# Patient Record
Sex: Female | Born: 1985
Health system: Southern US, Community
[De-identification: ages and names within clinical notes are randomized; demographics above are authoritative.]

## PROBLEM LIST (undated history)

## (undated) ENCOUNTER — Inpatient Hospital Stay (HOSPITAL_COMMUNITY): Payer: Self-pay

## (undated) DIAGNOSIS — Z973 Presence of spectacles and contact lenses: Secondary | ICD-10-CM

## (undated) DIAGNOSIS — E119 Type 2 diabetes mellitus without complications: Secondary | ICD-10-CM

## (undated) DIAGNOSIS — F32A Depression, unspecified: Secondary | ICD-10-CM

## (undated) DIAGNOSIS — M797 Fibromyalgia: Secondary | ICD-10-CM

## (undated) DIAGNOSIS — G5601 Carpal tunnel syndrome, right upper limb: Secondary | ICD-10-CM

## (undated) DIAGNOSIS — F329 Major depressive disorder, single episode, unspecified: Secondary | ICD-10-CM

## (undated) DIAGNOSIS — F419 Anxiety disorder, unspecified: Secondary | ICD-10-CM

## (undated) HISTORY — DX: Type 2 diabetes mellitus without complications: E11.9

## (undated) HISTORY — PX: OTHER SURGICAL HISTORY: SHX169

## (undated) HISTORY — DX: Anxiety disorder, unspecified: F41.9

## (undated) HISTORY — DX: Depression, unspecified: F32.A

## (undated) HISTORY — DX: Fibromyalgia: M79.7

---
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
## 2006-07-11 ENCOUNTER — Other Ambulatory Visit: Admission: RE | Admit: 2006-07-11 | Discharge: 2006-07-11 | Payer: Self-pay | Admitting: Obstetrics and Gynecology

## 2007-01-13 ENCOUNTER — Other Ambulatory Visit: Admission: RE | Admit: 2007-01-13 | Discharge: 2007-01-13 | Payer: Self-pay | Admitting: Obstetrics and Gynecology

## 2008-01-20 ENCOUNTER — Other Ambulatory Visit: Admission: RE | Admit: 2008-01-20 | Discharge: 2008-01-20 | Payer: Self-pay | Admitting: Obstetrics and Gynecology

## 2009-05-05 ENCOUNTER — Other Ambulatory Visit: Admission: RE | Admit: 2009-05-05 | Discharge: 2009-05-05 | Payer: Self-pay | Admitting: Obstetrics and Gynecology

## 2010-02-03 ENCOUNTER — Emergency Department (HOSPITAL_COMMUNITY): Admission: EM | Admit: 2010-02-03 | Discharge: 2010-02-03 | Payer: Self-pay | Admitting: Emergency Medicine

## 2010-06-08 ENCOUNTER — Other Ambulatory Visit: Admission: RE | Admit: 2010-06-08 | Discharge: 2010-06-08 | Payer: Self-pay | Admitting: Obstetrics and Gynecology

## 2010-10-22 LAB — CBC
HCT: 34.6 % — ABNORMAL LOW (ref 36.0–46.0)
Hemoglobin: 12.2 g/dL (ref 12.0–15.0)
MCH: 32.1 pg (ref 26.0–34.0)
MCHC: 35.1 g/dL (ref 30.0–36.0)
MCV: 91.3 fL (ref 78.0–100.0)
Platelets: 244 10*3/uL (ref 150–400)
RBC: 3.79 MIL/uL — ABNORMAL LOW (ref 3.87–5.11)
RDW: 12.3 % (ref 11.5–15.5)
WBC: 5.8 10*3/uL (ref 4.0–10.5)

## 2010-10-22 LAB — BASIC METABOLIC PANEL
BUN: 10 mg/dL (ref 6–23)
CO2: 27 mEq/L (ref 19–32)
Calcium: 9.4 mg/dL (ref 8.4–10.5)
Chloride: 109 mEq/L (ref 96–112)
Creatinine, Ser: 0.8 mg/dL (ref 0.4–1.2)
GFR calc Af Amer: >60 mL/min (ref >60)
GFR calc non Af Amer: >60 mL/min (ref >60)
Glucose, Bld: 102 mg/dL — ABNORMAL HIGH (ref 70–99)
Potassium: 3.7 mEq/L (ref 3.5–5.1)
Sodium: 141 mEq/L (ref 135–145)

## 2010-10-22 LAB — DIFFERENTIAL
Basophils Absolute: 0 10*3/uL (ref 0.0–0.1)
Basophils Relative: 1 % (ref 0–1)
Eosinophils Absolute: 0.1 10*3/uL (ref 0.0–0.7)
Eosinophils Relative: 2 % (ref 0–5)
Lymphocytes Relative: 40 % (ref 12–46)
Lymphs Abs: 2.3 10*3/uL (ref 0.7–4.0)
Monocytes Absolute: 0.5 10*3/uL (ref 0.1–1.0)
Monocytes Relative: 8 % (ref 3–12)
Neutro Abs: 2.8 10*3/uL (ref 1.7–7.7)
Neutrophils Relative %: 49 % (ref 43–77)

## 2010-10-22 LAB — TYPE AND SCREEN
ABO/RH(D): O POS
Antibody Screen: NEGATIVE

## 2010-10-22 LAB — PREGNANCY, URINE: Preg Test, Ur: NEGATIVE

## 2010-10-22 LAB — ABO/RH: ABO/RH(D): O POS

## 2010-10-22 LAB — HCG, QUANTITATIVE, PREGNANCY: hCG, Beta Chain, Quant, S: 2 m[IU]/mL (ref <5)

## 2010-12-22 NOTE — H&P (Signed)
Kristy Fowler, FILIPPONE           ACCOUNT NO.:  [id number]   MEDICAL RECORD NO.:  [id number]          PATIENT TYPE:  INP   LOCATION:  NA                            [facility]ILITY:  [facility]   PHYSICIAN:  Charles A. Delcambre, MDDATE OF BIRTH:  1986/07/05   DATE OF ADMISSION:  DATE OF DISCHARGE:                                HISTORY & PHYSICAL   CHIEF COMPLAINT:  Pregnancy-induced hypertension and 38-week pregnancy.   HISTORY OF PRESENT ILLNESS:  An 25 year old gravida 1 para 0-0-0-0 with [facility]  June 20, 2005, at 38 weeks 0 days upon admission; now to be induced  after presenting to the office today with an ongoing headache, worsening,  and pregnancy-induced hypertension labs with a uric acid creeping up now to  6.3, up from 6.0 on June 04, 2005; and blood pressure increasing now from  120s over 80s at 35 and 4, 130/90 at 36 and 5, 140/90 at 37 weeks, 130/90 at  37 weeks 2 days, 138/90 at 37 weeks 5 days, now 140/98 with the same blood  pressure 139-140 over 98 with blood pressure at home over the last 2 days.  Recheck blood pressure here 138/98 with continued headache and labs as noted  above. She is to be admitted now for induction secondary to these changes.  She has negative protein on urinalysis today.   PAST MEDICAL HISTORY:  UTI.   SURGICAL HISTORY:  Fracture of the leg.   MEDICATIONS:  Phenergan.   ALLERGIES:  SEDATIVE when she had an MRI, likely ATIVAN, made her go wild.   SOCIAL HISTORY:  No tobacco, ethanol, or drug use. The patient not sexually  active at this time. Not married.   FAMILY HISTORY:  Thrombophlebitis in maternal grandmother. Heart disease in  maternal grandfather. COPD in maternal grandfather. Diabetes in maternal  great-grandmother. Seizure disorder in her brother. Cancer, unknown source,  in a maternal aunt. The patient had club foot surgeries - that was her  extremity surgery. Maternal grandfather had MS. Otherwise, negative for  major illnesses.   REVIEW OF SYSTEMS:  No chest pain, shortness of breath. Positive for  headache. No scotomata, right upper quadrant pain. No shortness of breath,  wheezing. No contractions, ruptured membranes, or bleeding. Active fetal  movement is noted. No significant edema.   PHYSICAL EXAMINATION:  GENERAL:  Alert and oriented x3.  VITAL SIGNS:  Blood pressure 140/98. Weight 192 pounds, stable over the last  week, 2 pounds over the last 2 weeks of weight gain. Respirations 16, pulse  80.  HEENT:  Grossly within normal limits.  NECK:  Supple without thyromegaly.  LUNGS:  Clear bilaterally.  HEART:  Regular rate and rhythm with a 2/6 systolic ejection murmur left  sternal border.  BREASTS:  No masses, tenderness, discharge, skin or nipple change  bilaterally.  ABDOMEN:  Soft, gravid. Fundal height 37 cm. Estimated fetal weight 3500 g.  Fetal heart rate 150s.  PELVIC:  Cervix loose 1 cm, posterior, soft, 50%, -2, vertex, intact.  EXTREMITIES:  Minimal edema. Deep tendon reflexes 1-2+.   ASSESSMENT:  1.  Intrauterine pregnancy at 38 weeks.  2.  Pregnancy-induced hypertension not meeting definitive diagnosis of      preeclampsia without proteinuria but likely on this path.   PLAN:  Serial induction. Magnesium prophylaxis if headache worsens, labs  worsen, blood pressure worsens, or proteinuria develops. Repeat PIH labs in  the morning. Will place Cervidil or run Pitocin at low dose overnight at 2  mU/minute and start high-dose Pitocin in the morning once cervix is ripened  further, and proceed with AROM at that point as well. All questions are  answered and we will proceed as outlined. The patient is in agreement.      Charles A. Sydnee Cabal, MD  Electronically Signed     CAD/MEDQ  D:  06/06/2005  T:  06/06/2005  Job:  [id number]

## 2010-12-22 NOTE — Op Note (Signed)
Kristy Fowler, Kristy Fowler           ACCOUNT NO.:  [id number]   MEDICAL RECORD NO.:  [id number]          PATIENT TYPE:  INP   LOCATION:  9163                          [facility]ACILITY:  [facility]   PHYSICIAN:  Charles A. Delcambre, MDDATE OF BIRTH:  [birth year]   DATE OF PROCEDURE:  DATE OF DISCHARGE:                                 OPERATIVE REPORT   DELIVERY NOTE:  This patient was admitted to undergo induction secondary to pregnancy-  induced hypertension with blood pressure in the office of 140/98, gradually  increasing over the last several days.  See dictated history and physical.  In the hospital, blood pressure had settled to 130s/80s and 120s/80s.  She  had no protein of note.  PIH labs were negative in the office yesterday with  the exception of uric acid level of 6.3.  Cervix was 1-1.5, 50%, -2 station,  posterior and soft and Cervidil was placed overnight.  At 5:30 this morning,  with reactive fetal heart rate tracing overnight she had Cervidil removed  and cervix was 2 cm dilated upon my arrival at 0810 hours this morning.  She  was noted to be 8-0 cm, complete and 0 station.  Artifical rupture of  membranes was done.  Fetal and scalp electrode was placed.  No fluid was  noted.  Fetal movement was active and there was positive scalp stimulation  and fetal heart rate noted.  She was having contractions every 1-2 minutes,  some hyperstimulation about an hour prior.  At that time fetal heart rate  was 140s with early decelerations versus late decelerations.  After fetal  scalp electrode placement, this was a reactive tracing with some mild  variables occasionally.  She rapidly progressed to complete dilation at 0900  hours and delivered at 0907 hours a vigorous female.  Placenta delivered at  0918 hours and nuchal cord was reduced for delivery.  Apgars were 8 and 9.  Vigorous female.  Cord PH returned 7.24 arterial; 7.31 venous.  A second-  degree midline laceration was repaired with local  anesthetic.  Estimated  blood loss was 300 mL.  Mother and baby are recovering stably at this time.  Grandmother of the baby cut the cord.  Placenta was three-vessel and intact  and spontaneous.      Charles A. Sydnee Cabal, MD  Electronically Signed     CAD/MEDQ  D:  06/07/2005  T:  06/07/2005  Job:  [id number]

## 2011-04-13 ENCOUNTER — Other Ambulatory Visit: Payer: Self-pay | Admitting: Family Medicine

## 2011-04-13 DIAGNOSIS — R413 Other amnesia: Secondary | ICD-10-CM

## 2011-04-13 DIAGNOSIS — R51 Headache: Secondary | ICD-10-CM

## 2011-04-14 ENCOUNTER — Ambulatory Visit
Admission: RE | Admit: 2011-04-14 | Discharge: 2011-04-14 | Disposition: A | Discharge status: Home / Self Care | Payer: Medicaid Other | Source: Ambulatory Visit | Attending: Family Medicine | Admitting: Family Medicine

## 2011-04-14 DIAGNOSIS — R413 Other amnesia: Secondary | ICD-10-CM

## 2011-04-14 DIAGNOSIS — R51 Headache: Secondary | ICD-10-CM

## 2011-04-14 MED ORDER — GADOBENATE DIMEGLUMINE 529 MG/ML IV SOLN
10.0000 mL | Freq: Once | INTRAVENOUS | Status: AC | PRN
  Administered 2011-04-14: 10 mL via INTRAVENOUS

## 2011-07-06 ENCOUNTER — Other Ambulatory Visit: Payer: Self-pay | Admitting: Adult Health

## 2011-07-06 ENCOUNTER — Other Ambulatory Visit (HOSPITAL_COMMUNITY)
Admission: RE | Admit: 2011-07-06 | Discharge: 2011-07-06 | Disposition: A | Discharge status: Home / Self Care | Payer: Medicaid Other | Source: Ambulatory Visit | Attending: Obstetrics and Gynecology | Admitting: Obstetrics and Gynecology

## 2011-07-06 DIAGNOSIS — Z113 Encounter for screening for infections with a predominantly sexual mode of transmission: Secondary | ICD-10-CM | POA: Insufficient documentation

## 2011-07-06 DIAGNOSIS — Z01419 Encounter for gynecological examination (general) (routine) without abnormal findings: Secondary | ICD-10-CM | POA: Insufficient documentation

## 2012-01-03 ENCOUNTER — Other Ambulatory Visit (HOSPITAL_COMMUNITY): Payer: Self-pay | Admitting: Orthopedic Surgery

## 2012-01-03 DIAGNOSIS — M545 Low back pain, unspecified: Secondary | ICD-10-CM

## 2012-01-10 ENCOUNTER — Ambulatory Visit (HOSPITAL_COMMUNITY)
Admission: RE | Admit: 2012-01-10 | Discharge: 2012-01-10 | Disposition: A | Discharge status: Home / Self Care | Payer: Medicaid Other | Source: Ambulatory Visit | Attending: Orthopedic Surgery | Admitting: Orthopedic Surgery

## 2012-01-10 DIAGNOSIS — M79609 Pain in unspecified limb: Secondary | ICD-10-CM | POA: Insufficient documentation

## 2012-01-10 DIAGNOSIS — M545 Low back pain, unspecified: Secondary | ICD-10-CM | POA: Insufficient documentation

## 2012-04-03 LAB — OB RESULTS CONSOLE RUBELLA ANTIBODY, IGM: Rubella: IMMUNE

## 2012-04-03 LAB — OB RESULTS CONSOLE ANTIBODY SCREEN: Antibody Screen: NEGATIVE

## 2012-04-03 LAB — OB RESULTS CONSOLE ABO/RH: RH Type: POSITIVE

## 2012-04-03 LAB — OB RESULTS CONSOLE VARICELLA ZOSTER ANTIBODY, IGG: Varicella: IMMUNE

## 2012-04-03 LAB — OB RESULTS CONSOLE HEPATITIS B SURFACE ANTIGEN: Hepatitis B Surface Ag: NEGATIVE

## 2012-08-06 NOTE — L&D Delivery Note (Signed)
FAY BAGG is a 27 y.o. [id number] presenting at [redacted]w[redacted]d in active labor. She progressed to complete dilation without epidural and had a successful SVD.  Delivery Note At 5:17 PM a viable female was delivered via Vaginal, Spontaneous Delivery (Presentation: Left Occiput Anterior).  APGAR: 8, 9; weight .   Placenta status: Intact, Spontaneous.  Cord: 3 vessels with the following complications: None.  Cord pH: n/a  Anesthesia: None  Episiotomy: None Lacerations: None Suture Repair: n/a Est. Blood Loss (mL): 250  Mom to postpartum.  Baby to nursery-stable.  Napoleon Form 11/21/2012, 5:43 PM

## 2012-08-23 ENCOUNTER — Encounter (HOSPITAL_COMMUNITY): Payer: Self-pay | Admitting: *Deleted

## 2012-08-23 ENCOUNTER — Inpatient Hospital Stay (HOSPITAL_COMMUNITY)
Admission: AD | Admit: 2012-08-23 | Discharge: 2012-08-23 | Disposition: A | Discharge status: Home / Self Care | Payer: Medicaid Other | Source: Ambulatory Visit | Attending: Obstetrics & Gynecology | Admitting: Obstetrics & Gynecology

## 2012-08-23 DIAGNOSIS — O10019 Pre-existing essential hypertension complicating pregnancy, unspecified trimester: Secondary | ICD-10-CM | POA: Insufficient documentation

## 2012-08-23 DIAGNOSIS — O2650 Maternal hypotension syndrome, unspecified trimester: Secondary | ICD-10-CM | POA: Insufficient documentation

## 2012-08-23 DIAGNOSIS — R55 Syncope and collapse: Secondary | ICD-10-CM

## 2012-08-23 LAB — COMPREHENSIVE METABOLIC PANEL
ALT: 7 U/L (ref 0–35)
AST: 11 U/L (ref 0–37)
Albumin: 2.7 g/dL — ABNORMAL LOW (ref 3.5–5.2)
Alkaline Phosphatase: 83 U/L (ref 39–117)
BUN: 6 mg/dL (ref 6–23)
CO2: 22 mEq/L (ref 19–32)
Calcium: 8.9 mg/dL (ref 8.4–10.5)
Chloride: 103 mEq/L (ref 96–112)
Creatinine, Ser: 0.6 mg/dL (ref 0.50–1.10)
GFR calc Af Amer: >90 mL/min (ref >90)
GFR calc non Af Amer: >90 mL/min (ref >90)
Glucose, Bld: 86 mg/dL (ref 70–99)
Potassium: 4.2 mEq/L (ref 3.5–5.1)
Sodium: 135 mEq/L (ref 135–145)
Total Bilirubin: 0.2 mg/dL — ABNORMAL LOW (ref 0.3–1.2)
Total Protein: 6.9 g/dL (ref 6.0–8.3)

## 2012-08-23 LAB — URINALYSIS, ROUTINE W REFLEX MICROSCOPIC
Bilirubin Urine: NEGATIVE
Glucose, UA: NEGATIVE mg/dL
Ketones, ur: NEGATIVE mg/dL
Nitrite: NEGATIVE
Protein, ur: NEGATIVE mg/dL
Specific Gravity, Urine: 1.015 (ref 1.005–1.030)
Urobilinogen, UA: 0.2 mg/dL (ref 0.0–1.0)
pH: 6 (ref 5.0–8.0)

## 2012-08-23 LAB — CBC
HCT: 32.4 % — ABNORMAL LOW (ref 36.0–46.0)
Hemoglobin: 11.1 g/dL — ABNORMAL LOW (ref 12.0–15.0)
MCH: 30.7 pg (ref 26.0–34.0)
MCHC: 34.3 g/dL (ref 30.0–36.0)
MCV: 89.8 fL (ref 78.0–100.0)
Platelets: 218 10*3/uL (ref 150–400)
RBC: 3.61 MIL/uL — ABNORMAL LOW (ref 3.87–5.11)
RDW: 12.9 % (ref 11.5–15.5)
WBC: 9.1 10*3/uL (ref 4.0–10.5)

## 2012-08-23 LAB — URINE MICROSCOPIC-ADD ON

## 2012-08-23 NOTE — MAU Provider Note (Signed)
History     CSN: [id number]  Arrival date and time: 08/23/12 1327   None     Chief Complaint  Patient presents with  . Dizziness   HPI Kristy Fowler is a 27 y.o. G64P1011 female at [redacted]w[redacted]d who presents with report of near syncope this am at app 1100.  BP per home monitor at 1200 120/70. Had some early elevated bp's in office, was started on Aldomet 500mg  BID 1 week ago, last dose last night at 2000, hasn't yet taken this am's dose.  Lightheaded w/ HA's before being put on aldomet, still lightheaded daily, but no further HAs.  This am, she felt lightheaded and nauseated, got cold chills, lost vision and sat down on couch- doesn't think passed out.  Reports good fetal movement.  Denies lof. Some brownish spotting yesterday, none since, not related to sexual intercourse. Denies malodorous d/c or vulvovaginal itching/irriation. Denies dysuria, urinary frequency, hesitancy, urgency.  Next appt at [facility] on 1/30.   OB History    Grav Para Term Preterm Abortions TAB SAB Ect Mult Living   3 1 1  1 1    1       Past Medical History  Diagnosis Date  . Hypertension     on Methyldopa    No past surgical history on file.  No family history on file.  History  Substance Use Topics  . Smoking status: Not on file  . Smokeless tobacco: Not on file  . Alcohol Use: Not on file    Allergies: No Known Allergies  Prescriptions prior to admission  Medication Sig Dispense Refill  . methyldopa (ALDOMET) 500 MG tablet Take 500 mg by mouth 2 (two) times daily.      . Prenatal Vit-Fe Fumarate-FA (PRENATAL MULTIVITAMIN) TABS Take 1 tablet by mouth daily.        Review of Systems  Constitutional: Negative for fever. Chills: had episode of 'cold chills' right before almost passed out.  Eyes: Negative.        Lost vision briefly during episode, states vision fine now   Respiratory: Negative.   Cardiovascular: Negative.   Gastrointestinal: Positive for nausea (this am). Negative for vomiting and  abdominal pain.  Genitourinary: Negative.   Musculoskeletal: Negative.   Skin: Negative.   Neurological: Negative for headaches.       Lightheaded recently w/ near syncopal episode this am   Endo/Heme/Allergies: Negative.   Psychiatric/Behavioral: Negative.    Physical Exam   Blood pressure 118/67, pulse 78, temperature 97.9 F (36.6 C), temperature source Oral, resp. rate 18, height 5\' 4"  (1.626 m), weight 95.255 kg (210 lb), last menstrual period 12/30/2011, SpO2 99.00%.  Physical Exam  Constitutional: She is oriented to person, place, and time. She appears well-developed and well-nourished.  HENT:  Head: Normocephalic.  Neck: Normal range of motion.  Cardiovascular: Normal rate and regular rhythm.   Respiratory: Effort normal and breath sounds normal.  GI: Soft. There is no tenderness.       gravid  Genitourinary: Vagina normal and uterus normal.       Spec exam: cervix visually closed, no bleeding SVE: LTC  Musculoskeletal: Normal range of motion.  Neurological: She is alert and oriented to person, place, and time. She has normal reflexes.  Skin: Skin is warm and dry.  Psychiatric: She has a normal mood and affect. Her behavior is normal. Judgment and thought content normal.    MAU Course  Procedures  EFM CBC, CMP 12 lead EKG: normal sinus  rhythm, no abnormalities  Results for orders placed during the hospital encounter of 08/23/12 (from the past 24 hour(s))  URINALYSIS, ROUTINE W REFLEX MICROSCOPIC     Status: Abnormal   Collection Time   08/23/12  1:40 PM      Component Value Range   Color, Urine YELLOW  YELLOW   APPearance CLEAR  CLEAR   Specific Gravity, Urine 1.015  1.005 - 1.030   pH 6.0  5.0 - 8.0   Glucose, UA NEGATIVE  NEGATIVE mg/dL   Hgb urine dipstick TRACE (*) NEGATIVE   Bilirubin Urine NEGATIVE  NEGATIVE   Ketones, ur NEGATIVE  NEGATIVE mg/dL   Protein, ur NEGATIVE  NEGATIVE mg/dL   Urobilinogen, UA 0.2  0.0 - 1.0 mg/dL   Nitrite NEGATIVE   NEGATIVE   Leukocytes, UA SMALL (*) NEGATIVE  URINE MICROSCOPIC-ADD ON     Status: Abnormal   Collection Time   08/23/12  1:40 PM      Component Value Range   Squamous Epithelial / LPF MANY (*) RARE   WBC, UA 3-6  <3 WBC/hpf   RBC / HPF 0-2  <3 RBC/hpf   Bacteria, UA MANY (*) RARE  CBC     Status: Abnormal   Collection Time   08/23/12  2:56 PM      Component Value Range   WBC 9.1  4.0 - 10.5 K/uL   RBC 3.61 (*) 3.87 - 5.11 MIL/uL   Hemoglobin 11.1 (*) 12.0 - 15.0 g/dL   HCT 30.8 (*) 65.7 - 84.6 %   MCV 89.8  78.0 - 100.0 fL   MCH 30.7  26.0 - 34.0 pg   MCHC 34.3  30.0 - 36.0 g/dL   RDW 96.2  95.2 - 84.1 %   Platelets 218  150 - 400 K/uL  COMPREHENSIVE METABOLIC PANEL     Status: Abnormal   Collection Time   08/23/12  2:56 PM      Component Value Range   Sodium 135  135 - 145 mEq/L   Potassium 4.2  3.5 - 5.1 mEq/L   Chloride 103  96 - 112 mEq/L   CO2 22  19 - 32 mEq/L   Glucose, Bld 86  70 - 99 mg/dL   BUN 6  6 - 23 mg/dL   Creatinine, Ser 3.24  0.50 - 1.10 mg/dL   Calcium 8.9  8.4 - 40.1 mg/dL   Total Protein 6.9  6.0 - 8.3 g/dL   Albumin 2.7 (*) 3.5 - 5.2 g/dL   AST 11  0 - 37 U/L   ALT 7  0 - 35 U/L   Alkaline Phosphatase 83  39 - 117 U/L   Total Bilirubin 0.2 (*) 0.3 - 1.2 mg/dL   GFR calc non Af Amer >90  >90 mL/min   GFR calc Af Amer >90  >90 mL/min   Assessment and Plan  A:  [redacted]w[redacted]d SIUP  Near syncopal episode  Probable CHTN, just recently placed on Aldomet  Cat I FHR  Normal EKG P:  D/C home  F/U at [facility] on Mon, consider outpatient cardiology consult  Hold Aldomet if BP <= 140/90 until seen Mon (RN mom to check at home)  Increase po fluids   Discussed w/ Dr. Garth Schlatter, Cheron Every 08/23/2012, 2:43 PM

## 2012-08-23 NOTE — MAU Note (Signed)
"  I got up this morning and had a bowl of cereal.  About an hour to an hour and a half, I started feeling lightheaded.  I didn't actually didn't pass out, but I was at the point of blacking out.  I was already situated at the couch to sit down, so I eased myself down.  I was still really fuzzy feeling after that.  (+) FM.  No vomiting or diarrhea.  I felt a little nauseous beforehand, but I never threw up."

## 2012-08-23 NOTE — MAU Note (Addendum)
Pt reports she has been on medication for high blood pressure. Stated she felt a little nauseated and was walking and felt dizzy and lightheaded. Started having black out in her vision was able to sit down and stated to get better. Did not actually pass out. Still feeling lightheaded. B/p at home was 110/70.

## 2012-08-23 NOTE — MAU Provider Note (Signed)
Attestation of Attending Supervision of Advanced Practitioner (CNM/NP): Evaluation and management procedures were performed by the Advanced Practitioner under my supervision and collaboration. I have reviewed the Advanced Practitioner's note and chart, and I agree with the management and plan.  Diannie Willner H. 7:52 PM

## 2012-08-24 LAB — URINE CULTURE: Colony Count: 55000

## 2012-10-13 ENCOUNTER — Other Ambulatory Visit: Payer: Self-pay | Admitting: Obstetrics & Gynecology

## 2012-10-13 DIAGNOSIS — O10019 Pre-existing essential hypertension complicating pregnancy, unspecified trimester: Secondary | ICD-10-CM

## 2012-10-14 ENCOUNTER — Encounter: Payer: Self-pay | Admitting: *Deleted

## 2012-10-29 ENCOUNTER — Ambulatory Visit (INDEPENDENT_AMBULATORY_CARE_PROVIDER_SITE_OTHER): Payer: Medicaid Other | Admitting: Advanced Practice Midwife

## 2012-10-29 ENCOUNTER — Ambulatory Visit (INDEPENDENT_AMBULATORY_CARE_PROVIDER_SITE_OTHER): Payer: Medicaid Other

## 2012-10-29 ENCOUNTER — Other Ambulatory Visit: Payer: Self-pay | Admitting: Obstetrics & Gynecology

## 2012-10-29 VITALS — BP 138/88 | Wt 216.0 lb

## 2012-10-29 DIAGNOSIS — O10019 Pre-existing essential hypertension complicating pregnancy, unspecified trimester: Secondary | ICD-10-CM

## 2012-10-29 DIAGNOSIS — O10919 Unspecified pre-existing hypertension complicating pregnancy, unspecified trimester: Secondary | ICD-10-CM | POA: Insufficient documentation

## 2012-10-29 DIAGNOSIS — O09299 Supervision of pregnancy with other poor reproductive or obstetric history, unspecified trimester: Secondary | ICD-10-CM

## 2012-10-29 DIAGNOSIS — Z1389 Encounter for screening for other disorder: Secondary | ICD-10-CM

## 2012-10-29 DIAGNOSIS — O139 Gestational [pregnancy-induced] hypertension without significant proteinuria, unspecified trimester: Secondary | ICD-10-CM

## 2012-10-29 DIAGNOSIS — Z331 Pregnant state, incidental: Secondary | ICD-10-CM

## 2012-10-29 DIAGNOSIS — O10912 Unspecified pre-existing hypertension complicating pregnancy, second trimester: Secondary | ICD-10-CM

## 2012-10-29 LAB — POCT URINALYSIS DIPSTICK
Glucose, UA: NEGATIVE
Ketones, UA: NEGATIVE
Leukocytes, UA: NEGATIVE
Nitrite, UA: NEGATIVE
Protein, UA: NEGATIVE

## 2012-10-29 LAB — US OB FOLLOW UP

## 2012-10-29 NOTE — Progress Notes (Signed)
U/S-(36+2wks)-active vtx fetus, approp. Interval growth EFW 7 lb 3 oz (3248gms) 74%tile, fluid wnl AFI=9.17cm, BPP 8/8, post gr 2 plac, female fetus Konrad Felix")

## 2012-10-29 NOTE — Progress Notes (Signed)
Pt complain of a lot of pain and pressure.

## 2012-10-29 NOTE — Progress Notes (Signed)
No c/o at this time.  Routine questions about pregnancy answered.  F/U in 1 weeks for OBV.  

## 2012-11-05 ENCOUNTER — Encounter: Payer: Self-pay | Admitting: Advanced Practice Midwife

## 2012-11-05 ENCOUNTER — Ambulatory Visit (INDEPENDENT_AMBULATORY_CARE_PROVIDER_SITE_OTHER): Payer: Medicaid Other | Admitting: Advanced Practice Midwife

## 2012-11-05 VITALS — BP 118/78 | Wt 218.0 lb

## 2012-11-05 DIAGNOSIS — O09299 Supervision of pregnancy with other poor reproductive or obstetric history, unspecified trimester: Secondary | ICD-10-CM

## 2012-11-05 DIAGNOSIS — Z1389 Encounter for screening for other disorder: Secondary | ICD-10-CM

## 2012-11-05 DIAGNOSIS — O139 Gestational [pregnancy-induced] hypertension without significant proteinuria, unspecified trimester: Secondary | ICD-10-CM

## 2012-11-05 DIAGNOSIS — Z331 Pregnant state, incidental: Secondary | ICD-10-CM

## 2012-11-05 DIAGNOSIS — Z3483 Encounter for supervision of other normal pregnancy, third trimester: Secondary | ICD-10-CM

## 2012-11-05 LAB — OB RESULTS CONSOLE GC/CHLAMYDIA
Chlamydia: NEGATIVE
Gonorrhea: NEGATIVE

## 2012-11-05 LAB — POCT URINALYSIS DIPSTICK
Glucose, UA: NEGATIVE
Ketones, UA: NEGATIVE
Leukocytes, UA: NEGATIVE
Nitrite, UA: NEGATIVE
Protein, UA: NEGATIVE

## 2012-11-05 LAB — OB RESULTS CONSOLE GBS: GBS: NEGATIVE

## 2012-11-05 NOTE — Progress Notes (Signed)
No c/o at this time.  Routine questions about pregnancy answered.  F/U in 1 weeks for LROB.  

## 2012-11-13 ENCOUNTER — Encounter: Payer: Self-pay | Admitting: Obstetrics & Gynecology

## 2012-11-13 ENCOUNTER — Ambulatory Visit (INDEPENDENT_AMBULATORY_CARE_PROVIDER_SITE_OTHER): Payer: Medicaid Other | Admitting: Obstetrics & Gynecology

## 2012-11-13 VITALS — BP 120/80 | Wt 222.0 lb

## 2012-11-13 DIAGNOSIS — Z331 Pregnant state, incidental: Secondary | ICD-10-CM

## 2012-11-13 DIAGNOSIS — Z3490 Encounter for supervision of normal pregnancy, unspecified, unspecified trimester: Secondary | ICD-10-CM

## 2012-11-13 DIAGNOSIS — Z1389 Encounter for screening for other disorder: Secondary | ICD-10-CM

## 2012-11-13 DIAGNOSIS — Z3480 Encounter for supervision of other normal pregnancy, unspecified trimester: Secondary | ICD-10-CM | POA: Insufficient documentation

## 2012-11-13 DIAGNOSIS — Z3483 Encounter for supervision of other normal pregnancy, third trimester: Secondary | ICD-10-CM

## 2012-11-13 DIAGNOSIS — O09299 Supervision of pregnancy with other poor reproductive or obstetric history, unspecified trimester: Secondary | ICD-10-CM

## 2012-11-13 DIAGNOSIS — O139 Gestational [pregnancy-induced] hypertension without significant proteinuria, unspecified trimester: Secondary | ICD-10-CM

## 2012-11-13 LAB — POCT URINALYSIS DIPSTICK
Blood, UA: NEGATIVE
Glucose, UA: NEGATIVE
Ketones, UA: NEGATIVE
Leukocytes, UA: NEGATIVE
Nitrite, UA: NEGATIVE

## 2012-11-13 NOTE — Progress Notes (Signed)
BP weight and urine results all reviewed and noted. Patient reports good fetal movement, denies any bleeding and no rupture of membranes symptoms or regular contractions. Patient is without complaints. All questions were answered.  

## 2012-11-13 NOTE — Patient Instructions (Signed)
Epidural Risks and Benefits The continuous putting in (infusion) of local anesthetics through a long, narrow, hollow plastic tube (catheter)/needle into the lower (lumbar) area of your spine is commonly called an epidural. This means outside the covering of the spinal cord. The epidural catheter is placed in the space on the outside of the membrane that covers the spinal cord. The anesthetic medicine numbs the nerves of the spinal cord in the epidural space. There is also a spinal/epidural anesthetic using two needles and a catheter. The medication is first placed in the spinal canal. Then that needle is removed and a catheter is placed in the epidural space through the second needle for continuous anesthesia. This seems to be the most popular type of regional anesthesia used now. This is sometimes given for pain management to women who are giving birth. Spinal and epidural anesthesia are called regional anesthesia because they numb a certain region of the body. While it is an effective pain management tool, some reasons not to use this include:  Restricted mobility: The tubes and monitors connected to you do not allow for much moving around.  Increased likelihood of bladder catheterization, oxytocin administration, and internal monitoring. This means a tube (catheter) may have to be put into the bladder to drain the urine. Uterine contractions can become weaker and less frequent. They also may have a higher use of oxytocin than mothers not having regional anesthesia.  Increased likelihood of operative delivery: This includes the use of or need for forceps, vacuum extractor, episiotomy, or cesarean delivery. When the dose is too large, or when it sinks down into the "tailbone" (sacral) region of the body, the perineum and the birth canal (vagina) are anesthetized. Anesthetic is injected into this area late in labor to deaden all sensation. When it "accidentally" happens earlier in labor, the muscles of the  pelvic floor are relaxed too early. This interferes with the normal flexion and rotation of the baby's head as it passes through the birth canal. This interference can lead to abnormal presentations that are more dangerous for the baby.  Must use an automatic blood pressure cuff throughout labor. This is a cuff that automatically takes your blood pressure at regular intervals. SHORT TERM MATERNAL RISKS  Dural puncture - The dura is one of the membranes surrounding the spinal cord. If the anesthetic medication gets into the spinal canal through a dural puncture, it can result in a spinal anesthetic and spinal headache. Spinal headaches are treated with an epidural blood patch to cover the punctured area.  Low blood pressure (hypotension) - Nearly one third of women with an epidural will develop low blood pressure. The ways that patients must lay during the epidural can make this worse. Their position is limited because they will be unable to move their legs easily for the time of the anesthetic. Low blood pressure is also a risk for the baby. If the baby does not get enough oxygen from the mom's blood, it can result in an emergency Cesarean section. This means the baby is delivered by an operation through a cut by the surgeon (incision) on the belly of the mother.  Nausea, vomiting, and prolonged shivering.  Prolonged labor - With large doses of anesthetic medication, the patient loses the desire and the ability to bear down and push. This results in an increased use of forceps and vacuum extractions, compared to women having unmedicated deliveries.  Uneven, incomplete or non-existent pain relief. Sometimes the epidural does not work well and   additional medications may be needed for pain relief.  Difficulty breathing well or paralysis if the level of anesthesia goes too high in the spine.  Convulsions - If the anesthetic agent accidentally is injected into a blood vessel it can cause convulsions and  loss of consciousness.  Toxic drug reactions.  Septic meningitis - An abscess can form at the site where the epidural catheter is placed. If this spreads into the spinal canal it can cause meningitis.  Allergic reaction - This causes blood pressure to become too low and other medications and fluids must be given to bring the blood pressure up. Also rashes and difficulty breathing may develop.  Cardiac arrest - This is rare but real threat to the life of the mother and baby.  Fever is common.  Itching that is easily treated.  Spinal hematoma. LONG TERM MATERNAL RISKS  Neurological complications - A nerve problem called Horner's syndrome can develop with epidural anesthesia for vaginal delivery. It is impossible to predict which patients will develop a Horner's syndrome. Even the nerves to the face can be blocked, temporarily or permanently. Tremors and shakes can occur.  Paresthesia ("pins and needles"). This is a feeling that comes from inflammation of a nerve.  Dizziness and fainting can become a problem after epidurals. This is usually only for a couple of days. RISKS TO BABY  Direct drug toxicity.  Fetal distress, abnormal fetal heart rate (FHR) (can lead to emergency cesarean). This is especially true if the anesthetic gets into the mother's blood stream or too much medication is put into the epidural. REASONS NOT TO HAVE EPIDURAL ANESTHESIA  Increased costs.  The mother has a low blood pressure.  There are blood clotting problems.  A brain tumor is present.  There is an infection in the blood stream.  A skin infection at the needle site.  A tattoo at the needle site. BENEFITS  Regional anesthesia is the most effective pain relief for labor and delivery.  It is the best anesthetic for preeclampsia and eclampsia.  There is better pain control after delivery (vaginal or cesarean).  When done correctly, no medication gets to the baby.  Sooner ambulation after  delivery.  It can be left in place during all of labor.  You can be awake during a Cesarean delivery and see the baby immediately after delivery. AFTER THE PROCEDURE   You will be kept in bed for several hours to prevent headaches.  You will be kept in bed until your legs are no longer numb and it is safe to walk.  The length of time you spend in the hospital will depend on the type of surgery or procedure you have had.  The epidural catheter is removed after you no longer need it for pain. HOME CARE INSTRUCTIONS   Do not drive or operate any kind of machinery for at least 24 hours. Make sure there is someone to drive you home.  Do not drink alcohol for at least 24 hours after the anesthesia.  Do not make important decisions for at least 24 hours after the anesthesia.  Drink lots of fluids.  Return to your normal diet.  Keep all your postoperative appointments as scheduled. SEEK IMMEDIATE MEDICAL CARE IF:  You develop a fever or temperature over 98.6 F (37 C).  You have a persistent headache.  You develop dizziness, fainting or lightheadedness.  You develop weakness, numbness or tingling in your arms or legs.  You have a skin rash.  You   have difficulty breathing  You have a stiff neck with or without stiff back.  You develop chest pain. Document Released: 07/23/2005 Document Revised: 10/15/2011 Document Reviewed: 08/30/2008 [employer] Patient Information 2013 [employer].  

## 2012-11-19 ENCOUNTER — Ambulatory Visit (INDEPENDENT_AMBULATORY_CARE_PROVIDER_SITE_OTHER): Payer: Medicaid Other | Admitting: Obstetrics & Gynecology

## 2012-11-19 VITALS — BP 120/80 | Wt 220.0 lb

## 2012-11-19 DIAGNOSIS — Z3480 Encounter for supervision of other normal pregnancy, unspecified trimester: Secondary | ICD-10-CM

## 2012-11-19 DIAGNOSIS — O139 Gestational [pregnancy-induced] hypertension without significant proteinuria, unspecified trimester: Secondary | ICD-10-CM

## 2012-11-19 DIAGNOSIS — O99019 Anemia complicating pregnancy, unspecified trimester: Secondary | ICD-10-CM

## 2012-11-19 DIAGNOSIS — Z331 Pregnant state, incidental: Secondary | ICD-10-CM

## 2012-11-19 DIAGNOSIS — Z1389 Encounter for screening for other disorder: Secondary | ICD-10-CM

## 2012-11-19 LAB — POCT URINALYSIS DIPSTICK
Glucose, UA: NEGATIVE
Ketones, UA: NEGATIVE
Leukocytes, UA: NEGATIVE
Nitrite, UA: NEGATIVE
Protein, UA: NEGATIVE

## 2012-11-19 NOTE — Patient Instructions (Signed)
Epidural Risks and Benefits The continuous putting in (infusion) of local anesthetics through a long, narrow, hollow plastic tube (catheter)/needle into the lower (lumbar) area of your spine is commonly called an epidural. This means outside the covering of the spinal cord. The epidural catheter is placed in the space on the outside of the membrane that covers the spinal cord. The anesthetic medicine numbs the nerves of the spinal cord in the epidural space. There is also a spinal/epidural anesthetic using two needles and a catheter. The medication is first placed in the spinal canal. Then that needle is removed and a catheter is placed in the epidural space through the second needle for continuous anesthesia. This seems to be the most popular type of regional anesthesia used now. This is sometimes given for pain management to women who are giving birth. Spinal and epidural anesthesia are called regional anesthesia because they numb a certain region of the body. While it is an effective pain management tool, some reasons not to use this include:  Restricted mobility: The tubes and monitors connected to you do not allow for much moving around.  Increased likelihood of bladder catheterization, oxytocin administration, and internal monitoring. This means a tube (catheter) may have to be put into the bladder to drain the urine. Uterine contractions can become weaker and less frequent. They also may have a higher use of oxytocin than mothers not having regional anesthesia.  Increased likelihood of operative delivery: This includes the use of or need for forceps, vacuum extractor, episiotomy, or cesarean delivery. When the dose is too large, or when it sinks down into the "tailbone" (sacral) region of the body, the perineum and the birth canal (vagina) are anesthetized. Anesthetic is injected into this area late in labor to deaden all sensation. When it "accidentally" happens earlier in labor, the muscles of the  pelvic floor are relaxed too early. This interferes with the normal flexion and rotation of the baby's head as it passes through the birth canal. This interference can lead to abnormal presentations that are more dangerous for the baby.  Must use an automatic blood pressure cuff throughout labor. This is a cuff that automatically takes your blood pressure at regular intervals. SHORT TERM MATERNAL RISKS  Dural puncture - The dura is one of the membranes surrounding the spinal cord. If the anesthetic medication gets into the spinal canal through a dural puncture, it can result in a spinal anesthetic and spinal headache. Spinal headaches are treated with an epidural blood patch to cover the punctured area.  Low blood pressure (hypotension) - Nearly one third of women with an epidural will develop low blood pressure. The ways that patients must lay during the epidural can make this worse. Their position is limited because they will be unable to move their legs easily for the time of the anesthetic. Low blood pressure is also a risk for the baby. If the baby does not get enough oxygen from the mom's blood, it can result in an emergency Cesarean section. This means the baby is delivered by an operation through a cut by the surgeon (incision) on the belly of the mother.  Nausea, vomiting, and prolonged shivering.  Prolonged labor - With large doses of anesthetic medication, the patient loses the desire and the ability to bear down and push. This results in an increased use of forceps and vacuum extractions, compared to women having unmedicated deliveries.  Uneven, incomplete or non-existent pain relief. Sometimes the epidural does not work well and   additional medications may be needed for pain relief.  Difficulty breathing well or paralysis if the level of anesthesia goes too high in the spine.  Convulsions - If the anesthetic agent accidentally is injected into a blood vessel it can cause convulsions and  loss of consciousness.  Toxic drug reactions.  Septic meningitis - An abscess can form at the site where the epidural catheter is placed. If this spreads into the spinal canal it can cause meningitis.  Allergic reaction - This causes blood pressure to become too low and other medications and fluids must be given to bring the blood pressure up. Also rashes and difficulty breathing may develop.  Cardiac arrest - This is rare but real threat to the life of the mother and baby.  Fever is common.  Itching that is easily treated.  Spinal hematoma. LONG TERM MATERNAL RISKS  Neurological complications - A nerve problem called Horner's syndrome can develop with epidural anesthesia for vaginal delivery. It is impossible to predict which patients will develop a Horner's syndrome. Even the nerves to the face can be blocked, temporarily or permanently. Tremors and shakes can occur.  Paresthesia ("pins and needles"). This is a feeling that comes from inflammation of a nerve.  Dizziness and fainting can become a problem after epidurals. This is usually only for a couple of days. RISKS TO BABY  Direct drug toxicity.  Fetal distress, abnormal fetal heart rate (FHR) (can lead to emergency cesarean). This is especially true if the anesthetic gets into the mother's blood stream or too much medication is put into the epidural. REASONS NOT TO HAVE EPIDURAL ANESTHESIA  Increased costs.  The mother has a low blood pressure.  There are blood clotting problems.  A brain tumor is present.  There is an infection in the blood stream.  A skin infection at the needle site.  A tattoo at the needle site. BENEFITS  Regional anesthesia is the most effective pain relief for labor and delivery.  It is the best anesthetic for preeclampsia and eclampsia.  There is better pain control after delivery (vaginal or cesarean).  When done correctly, no medication gets to the baby.  Sooner ambulation after  delivery.  It can be left in place during all of labor.  You can be awake during a Cesarean delivery and see the baby immediately after delivery. AFTER THE PROCEDURE   You will be kept in bed for several hours to prevent headaches.  You will be kept in bed until your legs are no longer numb and it is safe to walk.  The length of time you spend in the hospital will depend on the type of surgery or procedure you have had.  The epidural catheter is removed after you no longer need it for pain. HOME CARE INSTRUCTIONS   Do not drive or operate any kind of machinery for at least 24 hours. Make sure there is someone to drive you home.  Do not drink alcohol for at least 24 hours after the anesthesia.  Do not make important decisions for at least 24 hours after the anesthesia.  Drink lots of fluids.  Return to your normal diet.  Keep all your postoperative appointments as scheduled. SEEK IMMEDIATE MEDICAL CARE IF:  You develop a fever or temperature over 98.6 F (37 C).  You have a persistent headache.  You develop dizziness, fainting or lightheadedness.  You develop weakness, numbness or tingling in your arms or legs.  You have a skin rash.  You   have difficulty breathing  You have a stiff neck with or without stiff back.  You develop chest pain. Document Released: 07/23/2005 Document Revised: 10/15/2011 Document Reviewed: 08/30/2008 [employer] Patient Information 2013 [employer].  

## 2012-11-19 NOTE — Progress Notes (Signed)
BP weight and urine results all reviewed and noted. Patient reports good fetal movement, denies any bleeding and no rupture of membranes symptoms or regular contractions. Patient is without complaints. All questions were answered.  

## 2012-11-20 ENCOUNTER — Encounter (HOSPITAL_COMMUNITY): Payer: Self-pay | Admitting: *Deleted

## 2012-11-20 ENCOUNTER — Inpatient Hospital Stay (HOSPITAL_COMMUNITY)
Admission: AD | Admit: 2012-11-20 | Discharge: 2012-11-20 | Disposition: A | Discharge status: Home / Self Care | Payer: Medicaid Other | Source: Ambulatory Visit | Attending: Obstetrics & Gynecology | Admitting: Obstetrics & Gynecology

## 2012-11-20 DIAGNOSIS — O479 False labor, unspecified: Secondary | ICD-10-CM | POA: Insufficient documentation

## 2012-11-20 NOTE — MAU Note (Signed)
Pt states her contractions started about 2am-have gotten more uncomfrotavble

## 2012-11-21 ENCOUNTER — Encounter (HOSPITAL_COMMUNITY): Payer: Self-pay

## 2012-11-21 ENCOUNTER — Inpatient Hospital Stay (HOSPITAL_COMMUNITY)
Admission: AD | Admit: 2012-11-21 | Discharge: 2012-11-23 | DRG: 775 | Disposition: A | Discharge status: Home / Self Care | Payer: Medicaid Other | Source: Ambulatory Visit | Attending: Family Medicine | Admitting: Family Medicine

## 2012-11-21 DIAGNOSIS — IMO0001 Reserved for inherently not codable concepts without codable children: Secondary | ICD-10-CM

## 2012-11-21 DIAGNOSIS — O139 Gestational [pregnancy-induced] hypertension without significant proteinuria, unspecified trimester: Principal | ICD-10-CM | POA: Diagnosis present

## 2012-11-21 LAB — RPR: RPR Ser Ql: NONREACTIVE

## 2012-11-21 LAB — CBC
HCT: 32.2 % — ABNORMAL LOW (ref 36.0–46.0)
Hemoglobin: 10.5 g/dL — ABNORMAL LOW (ref 12.0–15.0)
MCH: 27.1 pg (ref 26.0–34.0)
MCHC: 32.6 g/dL (ref 30.0–36.0)
MCV: 83.2 fL (ref 78.0–100.0)
Platelets: 228 10*3/uL (ref 150–400)
RBC: 3.87 MIL/uL (ref 3.87–5.11)
RDW: 14.1 % (ref 11.5–15.5)
WBC: 9.7 10*3/uL (ref 4.0–10.5)

## 2012-11-21 LAB — TYPE AND SCREEN
ABO/RH(D): O POS
Antibody Screen: NEGATIVE

## 2012-11-21 LAB — ABO/RH: ABO/RH(D): O POS

## 2012-11-21 MED ORDER — LANOLIN HYDROUS EX OINT: TOPICAL_OINTMENT | CUTANEOUS | Status: DC | PRN

## 2012-11-21 MED ORDER — OXYTOCIN 40 UNITS IN LACTATED RINGERS INFUSION - SIMPLE MED: 62.5000 mL/h | INTRAVENOUS | Status: DC | PRN

## 2012-11-21 MED ORDER — PHENYLEPHRINE 40 MCG/ML (10ML) SYRINGE FOR IV PUSH (FOR BLOOD PRESSURE SUPPORT)
80.0000 ug | PREFILLED_SYRINGE | INTRAVENOUS | Status: DC | PRN
  Filled 2012-11-21: qty 2

## 2012-11-21 MED ORDER — SENNOSIDES-DOCUSATE SODIUM 8.6-50 MG PO TABS
2.0000 | ORAL_TABLET | Freq: Every day | ORAL | Status: DC
  Administered 2012-11-21 – 2012-11-22 (×2): 2 via ORAL

## 2012-11-21 MED ORDER — FENTANYL 2.5 MCG/ML BUPIVACAINE 1/10 % EPIDURAL INFUSION (WH - ANES): 14.0000 mL/h | INTRAMUSCULAR | Status: DC | PRN

## 2012-11-21 MED ORDER — IBUPROFEN 600 MG PO TABS
600.0000 mg | ORAL_TABLET | Freq: Four times a day (QID) | ORAL | Status: DC
  Administered 2012-11-21 – 2012-11-23 (×6): 600 mg via ORAL
  Filled 2012-11-21 (×6): qty 1

## 2012-11-21 MED ORDER — BISACODYL 10 MG RE SUPP: 10.0000 mg | Freq: Every day | RECTAL | Status: DC | PRN

## 2012-11-21 MED ORDER — DIBUCAINE 1 % RE OINT: 1.0000 "application " | TOPICAL_OINTMENT | RECTAL | Status: DC | PRN

## 2012-11-21 MED ORDER — ONDANSETRON HCL 4 MG PO TABS: 4.0000 mg | ORAL_TABLET | ORAL | Status: DC | PRN

## 2012-11-21 MED ORDER — TETANUS-DIPHTH-ACELL PERTUSSIS 5-2.5-18.5 LF-MCG/0.5 IM SUSP
0.5000 mL | Freq: Once | INTRAMUSCULAR | Status: AC
  Administered 2012-11-22: 0.5 mL via INTRAMUSCULAR
  Filled 2012-11-21: qty 0.5

## 2012-11-21 MED ORDER — FLEET ENEMA 7-19 GM/118ML RE ENEM: 1.0000 | ENEMA | Freq: Every day | RECTAL | Status: DC | PRN

## 2012-11-21 MED ORDER — DIPHENHYDRAMINE HCL 25 MG PO CAPS: 25.0000 mg | ORAL_CAPSULE | Freq: Four times a day (QID) | ORAL | Status: DC | PRN

## 2012-11-21 MED ORDER — FERROUS SULFATE 325 (65 FE) MG PO TABS
325.0000 mg | ORAL_TABLET | Freq: Two times a day (BID) | ORAL | Status: DC
  Administered 2012-11-22 – 2012-11-23 (×3): 325 mg via ORAL
  Filled 2012-11-21 (×3): qty 1

## 2012-11-21 MED ORDER — BENZOCAINE-MENTHOL 20-0.5 % EX AERO
1.0000 "application " | INHALATION_SPRAY | CUTANEOUS | Status: DC | PRN
  Administered 2012-11-21: 1 via TOPICAL
  Filled 2012-11-21: qty 56

## 2012-11-21 MED ORDER — OXYCODONE-ACETAMINOPHEN 5-325 MG PO TABS: 1.0000 | ORAL_TABLET | ORAL | Status: DC | PRN

## 2012-11-21 MED ORDER — LACTATED RINGERS IV SOLN
500.0000 mL | INTRAVENOUS | Status: DC | PRN
Start: 2012-11-21 — End: 2012-11-21

## 2012-11-21 MED ORDER — IBUPROFEN 600 MG PO TABS
600.0000 mg | ORAL_TABLET | Freq: Four times a day (QID) | ORAL | Status: DC | PRN
  Administered 2012-11-21: 600 mg via ORAL
  Filled 2012-11-21: qty 1

## 2012-11-21 MED ORDER — SIMETHICONE 80 MG PO CHEW: 80.0000 mg | CHEWABLE_TABLET | ORAL | Status: DC | PRN

## 2012-11-21 MED ORDER — ZOLPIDEM TARTRATE 5 MG PO TABS: 5.0000 mg | ORAL_TABLET | Freq: Every evening | ORAL | Status: DC | PRN

## 2012-11-21 MED ORDER — OXYTOCIN 40 UNITS IN LACTATED RINGERS INFUSION - SIMPLE MED
62.5000 mL/h | INTRAVENOUS | Status: DC
  Filled 2012-11-21: qty 1000

## 2012-11-21 MED ORDER — ACETAMINOPHEN 325 MG PO TABS: 650.0000 mg | ORAL_TABLET | ORAL | Status: DC | PRN

## 2012-11-21 MED ORDER — NALBUPHINE SYRINGE 5 MG/0.5 ML
10.0000 mg | INJECTION | INTRAMUSCULAR | Status: DC | PRN
  Administered 2012-11-21: 10 mg via INTRAVENOUS
  Filled 2012-11-21 (×2): qty 1

## 2012-11-21 MED ORDER — MEASLES, MUMPS & RUBELLA VAC ~~LOC~~ INJ: 0.5000 mL | INJECTION | Freq: Once | SUBCUTANEOUS | Status: DC

## 2012-11-21 MED ORDER — WITCH HAZEL-GLYCERIN EX PADS: 1.0000 "application " | MEDICATED_PAD | CUTANEOUS | Status: DC | PRN

## 2012-11-21 MED ORDER — DIPHENHYDRAMINE HCL 50 MG/ML IJ SOLN: 12.5000 mg | INTRAMUSCULAR | Status: DC | PRN

## 2012-11-21 MED ORDER — LACTATED RINGERS IV SOLN: 500.0000 mL | Freq: Once | INTRAVENOUS | Status: DC

## 2012-11-21 MED ORDER — FENTANYL CITRATE 0.05 MG/ML IJ SOLN
100.0000 ug | INTRAMUSCULAR | Status: DC | PRN
  Administered 2012-11-21: 100 ug via INTRAVENOUS
  Filled 2012-11-21: qty 2

## 2012-11-21 MED ORDER — PRENATAL MULTIVITAMIN CH
1.0000 | ORAL_TABLET | Freq: Every day | ORAL | Status: DC
  Administered 2012-11-22: 1 via ORAL
  Filled 2012-11-21: qty 1

## 2012-11-21 MED ORDER — EPHEDRINE 5 MG/ML INJ
10.0000 mg | INTRAVENOUS | Status: DC | PRN
  Filled 2012-11-21: qty 2

## 2012-11-21 MED ORDER — CITRIC ACID-SODIUM CITRATE 334-500 MG/5ML PO SOLN: 30.0000 mL | ORAL | Status: DC | PRN

## 2012-11-21 MED ORDER — OXYTOCIN BOLUS FROM INFUSION
500.0000 mL | INTRAVENOUS | Status: DC
  Administered 2012-11-21: 500 mL via INTRAVENOUS

## 2012-11-21 MED ORDER — LACTATED RINGERS IV SOLN
INTRAVENOUS | Status: DC
  Administered 2012-11-21: 14:00:00 via INTRAVENOUS

## 2012-11-21 MED ORDER — ONDANSETRON HCL 4 MG/2ML IJ SOLN: 4.0000 mg | INTRAMUSCULAR | Status: DC | PRN

## 2012-11-21 MED ORDER — ONDANSETRON HCL 4 MG/2ML IJ SOLN: 4.0000 mg | Freq: Four times a day (QID) | INTRAMUSCULAR | Status: DC | PRN

## 2012-11-21 MED ORDER — LIDOCAINE HCL (PF) 1 % IJ SOLN
30.0000 mL | INTRAMUSCULAR | Status: DC | PRN
  Filled 2012-11-21 (×2): qty 30

## 2012-11-21 NOTE — MAU Note (Signed)
Pt states u/c's since yesterday and early this am lost mucus plug, ctx's q5-6 minutes apart, breathing through ctx's.

## 2012-11-21 NOTE — H&P (Signed)
Kristy Fowler is a 27 y.o. female  G3P1011 at [redacted]w[redacted]d based on LMP presenting for Contractions that started yesterday and got more severe and closer in duration.  [facility] States only complication was the HTN.  Has 1 living child now was a successful vaginal delivery, pt states no complications with delivery.  She gets her care at [facility]. No complications of pregnancy. History of Chronic htn: Elevated pressures back in January pressures have been stable in the 120-130's/80's since then. Was advised to hold methyldopa if BP less than 140/90.     Maternal Medical History:  Reason for admission: Contractions.  Nausea.  Contractions: Onset was yesterday.   Frequency: regular.   Duration is approximately 1 minute.   Perceived severity is moderate.    Fetal activity: Perceived fetal activity is normal.   Last perceived fetal movement was within the past hour.    Prenatal complications: PIH.   No bleeding.     OB History   Grav Para Term Preterm Abortions TAB SAB Ect Mult Living   3 1 1  1 1    1      Past Medical History  Diagnosis Date  . Abnormal pap   . Hypertension     on [place]   Past Surgical History  Procedure Laterality Date  . Club foot release      left foot and left ankle surgery  . No past surgeries     Family History: family history includes Cancer in her other; Diabetes in her other; Heart disease in her other; Hypertension in her other; and Stroke in her other. Social History:  reports that she has never smoked. She does not have any smokeless tobacco history on file. She reports that she does not drink alcohol or use illicit drugs.   Prenatal Transfer Tool  Maternal Diabetes: No Genetic Screening: Normal Maternal Ultrasounds/Referrals: Normal Fetal Ultrasounds or other Referrals:  None Maternal Substance Abuse:  No Significant Maternal Medications:  None Significant Maternal Lab Results:  None Other Comments:  None  Review of  Systems  Constitutional: Negative for fever and chills.  Eyes: Negative for blurred vision, double vision and photophobia.  Respiratory: Negative.   Cardiovascular: Positive for leg swelling (swelling in hands in feet for the last month).  Gastrointestinal: Negative for heartburn, nausea, vomiting, abdominal pain, diarrhea and constipation.  Genitourinary: Negative for dysuria, urgency, frequency and hematuria.  Musculoskeletal: Positive for myalgias (legs are aching).  Neurological: Negative for dizziness, tingling and headaches.    Dilation: 4 Effacement (%): 60 Station: -2 Exam by:: SBeck, RN Blood pressure 126/59, pulse 103, temperature 100.1 F (37.8 C), temperature source Oral, resp. rate 20, height 5\' 3"  (1.6 m), weight 92.08 kg (203 lb), last menstrual period 12/30/2011, SpO2 100.00%. Maternal Exam:  Abdomen: Patient reports no abdominal tenderness.   Physical Exam  Constitutional: She is oriented to person, place, and time. She appears well-developed and well-nourished. No distress.  HENT:  Head: Normocephalic and atraumatic.  Eyes: Conjunctivae and EOM are normal.  Neck: Normal range of motion. Neck supple.  Cardiovascular: Normal rate.   Respiratory: Effort normal. No respiratory distress.  GI: Soft. There is no rebound and no guarding.  Musculoskeletal: Normal range of motion. She exhibits no edema and no tenderness.  Neurological: She is alert and oriented to person, place, and time.  Skin: Skin is warm and dry.  Psychiatric: She has a normal mood and affect.     FHTS:   150,  mod var, accels present, no decels TOCO:  Q 3-5  Prenatal labs: ABO, Rh: O/Positive/-- (08/29 0000) Antibody: Negative (08/29 0000) Rubella: Immune (08/29 0000) RPR:   pending HBsAg: Negative (08/29 0000)  HIV:    GBS:  Negative   Assessment/Plan: Admit to L and D  Not planning on epidural  Plans on breast/bottle feeds Future contraception methods nuva-ring.  Rosalee Kaufman 11/21/2012, 12:59 PM  I saw and examined patient and agree with above student note. I reviewed prenatal history, imaging, labs, and vitals. I personally reviewed the fetal heart tracing, and it is reactive. Napoleon Form, MD

## 2012-11-21 NOTE — H&P (Signed)
Chart reviewed and agree with management and plan.  

## 2012-11-22 NOTE — Progress Notes (Signed)
Post Partum Day 1 Subjective: no complaints and tolerating PO; breast and bottle feeding; plans on condoms while breastfeeding and then Nuvaring when weans  Objective: Blood pressure 100/51, pulse 76, temperature 98.2 F (36.8 C), temperature source Oral, resp. rate 18, height 5\' 3"  (1.6 m), weight 203 lb (92.08 kg), last menstrual period 12/30/2011, SpO2 100.00%, unknown if currently breastfeeding.  Physical Exam:  General: alert, cooperative and no distress Lochia: appropriate Uterine Fundus: firm DVT Evaluation: No evidence of DVT seen on physical exam.   Recent Labs  11/21/12 1345  HGB 10.5*  HCT 32.2*    Assessment/Plan: Plan for discharge tomorrow   LOS: 1 day   Kristy Fowler 11/22/2012, 7:46 AM

## 2012-11-23 NOTE — Discharge Summary (Signed)
Obstetric Discharge Summary Reason for Admission: onset of labor Prenatal Procedures: ultrasound Intrapartum Procedures: spontaneous vaginal delivery Postpartum Procedures: none Complications-Operative and Postpartum: none Hemoglobin  Date Value Range Status  11/21/2012 10.5* 12.0 - 15.0 g/dL Final     HCT  Date Value Range Status  11/21/2012 32.2* 36.0 - 46.0 % Final    Physical Exam:  General: alert, cooperative and no distress Lochia: appropriate Uterine Fundus: firm  DVT Evaluation: No evidence of DVT seen on physical exam.  Discharge Diagnoses: Term Pregnancy-delivered  Discharge Information: Date: 11/23/2012 Activity: pelvic rest Diet: routine Medications: None Condition: stable Instructions: postpartum care Discharge to: home Follow-up Information   Follow up with [facility] In 6 weeks.   Contact information:   [address] [place] [address] [phone]      Newborn Data: Live born female  Birth Weight: 8 lb 5 oz (3770 g) APGAR: 8, 9  Home with mother.  ARNOLD,JAMES 11/23/2012, 8:02 AM

## 2012-11-24 NOTE — Progress Notes (Signed)
Post discharge chart review completed.  

## 2012-11-27 ENCOUNTER — Encounter: Payer: Medicaid Other | Admitting: Obstetrics & Gynecology

## 2012-12-30 ENCOUNTER — Encounter: Payer: Self-pay | Admitting: Adult Health

## 2012-12-30 ENCOUNTER — Ambulatory Visit (INDEPENDENT_AMBULATORY_CARE_PROVIDER_SITE_OTHER): Payer: Medicaid Other | Admitting: Adult Health

## 2012-12-30 DIAGNOSIS — Z309 Encounter for contraceptive management, unspecified: Secondary | ICD-10-CM

## 2012-12-30 MED ORDER — NORETHINDRONE 0.35 MG PO TABS: 1.0000 | ORAL_TABLET | Freq: Every day | ORAL | Status: DC

## 2012-12-30 NOTE — Progress Notes (Signed)
Patient ID: Kristy Fowler, female   DOB: 1985/08/08, 27 y.o.   MRN: [id number]  Delivery Date:11/21/12 Method of Delivery:vaginal   Sexual Activity since delivery:no   Method of Feeding: Breast and bottle  Number of weeks bleeding post delivery: 2-3 weeks  Review of Systems: Patient denies any  blurred vision, shortness of breath, chest pain, abdominal pain, problems with bowel movements, urination, or intercourse. Has occasional headaches, takes tylenol with relief, no sex yet wants to try micronor after discussing the pros and cons of micronor vs nuva ring. Not depressed. Has noticed not as much milk since trying fenugreek. Reviewed past medical,surgical, social and family history. Reviewed medications and allergies.  Depression Score: 4  Pelvic Exam:   External genitalia is normal in appearance.  The vagina is normal in appearance.Positive blood in vault, may be starting period today. The cervix is bulbous.  Uterus is felt to be normal size, shape, and contour, well involuted.  No adnexal masses or tenderness noted.   Impression:  Status post delivery, post partum check, depression screening, contraceptive management. Breast feeding  Plan:   Start micronor, Rx micronor take 1 daily with 11 refills at [facility] Use condoms x 1 month Increase fluids and pump more, and make sure getting enough calories Return in 6 months for pap and physical

## 2012-12-30 NOTE — Patient Instructions (Addendum)
Start micronor, Use condoms Return in 6 months for pap and physical Increase fluids and pump more

## 2013-02-17 ENCOUNTER — Other Ambulatory Visit: Payer: Self-pay | Admitting: Adult Health

## 2013-04-07 ENCOUNTER — Ambulatory Visit (INDEPENDENT_AMBULATORY_CARE_PROVIDER_SITE_OTHER): Payer: Medicaid Other | Admitting: Adult Health

## 2013-04-07 ENCOUNTER — Encounter: Payer: Self-pay | Admitting: Adult Health

## 2013-04-07 VITALS — BP 120/78 | Ht 64.0 in | Wt 205.0 lb

## 2013-04-07 DIAGNOSIS — Z3200 Encounter for pregnancy test, result unknown: Secondary | ICD-10-CM

## 2013-04-07 DIAGNOSIS — Z309 Encounter for contraceptive management, unspecified: Secondary | ICD-10-CM

## 2013-04-07 DIAGNOSIS — Z3049 Encounter for surveillance of other contraceptives: Secondary | ICD-10-CM

## 2013-04-07 DIAGNOSIS — Z3202 Encounter for pregnancy test, result negative: Secondary | ICD-10-CM

## 2013-04-07 MED ORDER — NORETHIN ACE-ETH ESTRAD-FE 1-20 MG-MCG(24) PO CHEW: 1.0000 | CHEWABLE_TABLET | Freq: Every day | ORAL | Status: DC

## 2013-04-07 NOTE — Patient Instructions (Addendum)
Start minastrin today Use condoms x 2 weeks  Follow up in 3 months

## 2013-04-07 NOTE — Progress Notes (Signed)
Subjective:     Patient ID: Kristy Fowler, female   DOB: 08/24/1985, 27 y.o.   MRN: [id number]  HPI Kristy Fowler is in to change birth control she has been on micronor and has stopped breastfeeeding and her periods are irregular.  Review of Systems Positives in HPI Reviewed past medical,surgical, social and family history. Reviewed medications and allergies.      Objective:   Physical Exam BP 120/78  Ht 5\' 4"  (1.626 m)  Wt 205 lb (92.987 kg)  BMI 35.17 kg/m2  Breastfeeding? No   wants a pill with regular cycles, no migraines with aura or other reason not to take pill.  Assessment:     Contraceptive management    Plan:     Start minastrin today Number of samples 1  Lot number [id number]    Exp date 4/15 Rx minastrin with 11 refills, take 1 daily Use condoms Follow up in 3 months

## 2013-04-08 LAB — POCT URINE PREGNANCY: Preg Test, Ur: NEGATIVE

## 2013-09-28 ENCOUNTER — Other Ambulatory Visit: Payer: Self-pay | Admitting: Adult Health

## 2013-09-30 ENCOUNTER — Other Ambulatory Visit: Payer: Self-pay | Admitting: Adult Health

## 2014-01-25 ENCOUNTER — Encounter: Payer: Self-pay | Admitting: Adult Health

## 2014-01-25 ENCOUNTER — Ambulatory Visit (INDEPENDENT_AMBULATORY_CARE_PROVIDER_SITE_OTHER): Payer: Medicaid Other | Admitting: Adult Health

## 2014-01-25 ENCOUNTER — Other Ambulatory Visit (HOSPITAL_COMMUNITY)
Admission: RE | Admit: 2014-01-25 | Discharge: 2014-01-25 | Disposition: A | Discharge status: Home / Self Care | Payer: Medicaid Other | Source: Ambulatory Visit | Attending: Adult Health | Admitting: Adult Health

## 2014-01-25 VITALS — BP 116/84 | HR 74 | Ht 64.0 in | Wt 235.0 lb

## 2014-01-25 DIAGNOSIS — Z113 Encounter for screening for infections with a predominantly sexual mode of transmission: Secondary | ICD-10-CM | POA: Insufficient documentation

## 2014-01-25 DIAGNOSIS — Z3041 Encounter for surveillance of contraceptive pills: Secondary | ICD-10-CM

## 2014-01-25 DIAGNOSIS — Z01419 Encounter for gynecological examination (general) (routine) without abnormal findings: Secondary | ICD-10-CM | POA: Insufficient documentation

## 2014-01-25 DIAGNOSIS — Z0000 Encounter for general adult medical examination without abnormal findings: Secondary | ICD-10-CM

## 2014-01-25 MED ORDER — NORETHIN ACE-ETH ESTRAD-FE 1-20 MG-MCG(24) PO CHEW
1.0000 | CHEWABLE_TABLET | Freq: Every day | ORAL | Status: DC
Start: 2014-01-25 — End: 2015-01-17

## 2014-01-25 NOTE — Patient Instructions (Signed)
Physical in 1 year Try WHOLE 30 and increase activity

## 2014-01-25 NOTE — Progress Notes (Signed)
Patient ID: Kristy Fowler, female   DOB: 13-Jul-1986, 28 y.o.   MRN: [id number] History of Present Illness: Kristy Fowler is a 28 year old Hispanic female in for a pap and physical, she is complaining of with gain.She has fibromyalgia and has recently started neurontin.  Current Medications, Allergies, Past Medical History, Past Surgical History, Family History and Social History were reviewed in [facility] electronic medical record.     Review of Systems: Patient denies any headaches, blurred vision, shortness of breath, chest pain, abdominal pain, problems with bowel movements, urination, or intercourse. Has decrease libido, moods are good, has fibromyalgia so has body aches, see HPI for positives.    Physical Exam:BP 116/84  Pulse 74  Ht 5\' 4"  (1.626 m)  Wt 235 lb (106.595 kg)  BMI 40.32 kg/m2  LMP 01/21/2014  Breastfeeding? No General:  Well developed, well nourished, no acute distress Skin:  Warm and dry Neck:  Midline trachea, normal thyroid Lungs; Clear to auscultation bilaterally Breast:  No dominant palpable mass, retraction, or nipple discharge Cardiovascular: Regular rate and rhythm Abdomen:  Soft, non tender, no hepatosplenomegaly Pelvic:  External genitalia is normal in appearance.  The vagina is normal in appearance.  The cervix is bulbous.Pap with GC/CHL and reflex HPV performed  Uterus is felt to be normal size, shape, and contour.  No   adnexal masses or tenderness noted. Extremities:  No swelling or varicosities noted, has scar left foot and ankle where had surgery as child for club foot and that foot is smaller Psych:  No mood changes, alert and cooperative. Seems happy Discussed diet changes and increase activity try the pool and have sex often with good lubricant  Impression: Yearly gyn exam  Contraceptive management    Plan: Physical in 1 year Try Whole 30 and increase activity Refilled minastrin 1 daily with 11 refills

## 2014-01-27 LAB — CYTOLOGY - PAP

## 2014-05-06 ENCOUNTER — Other Ambulatory Visit: Payer: Self-pay | Admitting: Adult Health

## 2014-06-07 ENCOUNTER — Encounter: Payer: Self-pay | Admitting: Adult Health

## 2014-09-01 ENCOUNTER — Other Ambulatory Visit: Payer: Self-pay | Admitting: Adult Health

## 2014-12-16 ENCOUNTER — Other Ambulatory Visit: Payer: Self-pay | Admitting: Adult Health

## 2015-01-17 ENCOUNTER — Other Ambulatory Visit: Payer: Self-pay | Admitting: Adult Health

## 2015-01-26 ENCOUNTER — Encounter (INDEPENDENT_AMBULATORY_CARE_PROVIDER_SITE_OTHER): Payer: Medicaid Other | Admitting: Adult Health

## 2015-01-27 NOTE — Progress Notes (Signed)
This encounter was created in error - please disregard.

## 2015-02-01 ENCOUNTER — Ambulatory Visit (INDEPENDENT_AMBULATORY_CARE_PROVIDER_SITE_OTHER): Payer: Medicaid Other | Admitting: Adult Health

## 2015-02-01 ENCOUNTER — Encounter: Payer: Self-pay | Admitting: Adult Health

## 2015-02-01 VITALS — BP 122/80 | HR 108 | Ht 63.5 in | Wt 240.5 lb

## 2015-02-01 DIAGNOSIS — Z0000 Encounter for general adult medical examination without abnormal findings: Secondary | ICD-10-CM | POA: Diagnosis not present

## 2015-02-01 DIAGNOSIS — Z01419 Encounter for gynecological examination (general) (routine) without abnormal findings: Secondary | ICD-10-CM

## 2015-02-01 DIAGNOSIS — Z3041 Encounter for surveillance of contraceptive pills: Secondary | ICD-10-CM

## 2015-02-01 MED ORDER — NORETHIN ACE-ETH ESTRAD-FE 1-20 MG-MCG(24) PO CHEW: CHEWABLE_TABLET | ORAL | Status: DC

## 2015-02-01 NOTE — Patient Instructions (Signed)
Physical in 1 year Continue OCs  

## 2015-02-01 NOTE — Progress Notes (Signed)
Patient ID: Kristy Fowler, female   DOB: 16-Nov-1985, 29 y.o.   MRN: [id number] History of Present Illness: Kristy Fowler is a 29 year old, white female in for well woman gyn exam.She had a normal pap 01/25/14.   Current Medications, Allergies, Past Medical History, Past Surgical History, Family History and Social History were reviewed in [facility] electronic medical record.     Review of Systems: Patient denies any headaches, hearing loss, fatigue, blurred vision, shortness of breath, chest pain, abdominal pain, problems with bowel movements, urination, or intercourse. No joint pain or mood swings.Has body aches with her fibromyalgia, is taking cymbalta, which takes the edge off.    Physical Exam:BP 122/80 mmHg  Pulse 108  Ht 5' 3.5" (1.613 m)  Wt 240 lb 8 oz (109.09 kg)  BMI 41.93 kg/m2  LMP 01/11/2015 General:  Well developed, well nourished, no acute distress Skin:  Warm and dry Neck:  Midline trachea, normal thyroid, good ROM, no lymphadenopathy Lungs; Clear to auscultation bilaterally Breast:  No dominant palpable mass, retraction, or nipple discharge Cardiovascular: Regular rate and rhythm Abdomen:  Soft, non tender, no hepatosplenomegaly Pelvic:  External genitalia is normal in appearance, no lesions.  The vagina is normal in appearance, spotting today. Urethra has no lesions or masses. The cervix is bulbous.  Uterus is felt to be normal size, shape, and contour.  No adnexal masses or tenderness noted.Bladder is non tender, no masses felt. Extremities/musculoskeletal:  No swelling or varicosities noted, no clubbing or cyanosis Psych:  No mood changes, alert and cooperative,seems happy   Impression: Well woman gyn exam no pap Contraceptive management    Plan: Refilled minastrin take 1 daily disp 1 pack, with 11 refills Physical and pap in 1 year

## 2015-02-15 ENCOUNTER — Encounter (HOSPITAL_BASED_OUTPATIENT_CLINIC_OR_DEPARTMENT_OTHER): Payer: Self-pay | Admitting: *Deleted

## 2015-02-16 ENCOUNTER — Encounter (HOSPITAL_BASED_OUTPATIENT_CLINIC_OR_DEPARTMENT_OTHER): Payer: Self-pay | Admitting: *Deleted

## 2015-02-16 NOTE — Progress Notes (Signed)
NPO AFTER MN WITH EXCEPTION CLEAR LIQUIDS UNTIL 0700 (NO CREAM /MILK PRODUCTS).  ARRIVE AT 1130. NEEDS HG AND URINE PREG.  

## 2015-02-17 ENCOUNTER — Encounter (HOSPITAL_BASED_OUTPATIENT_CLINIC_OR_DEPARTMENT_OTHER)
Admission: RE | Disposition: A | Discharge status: Home / Self Care | Payer: Self-pay | Source: Ambulatory Visit | Attending: Orthopedic Surgery

## 2015-02-17 ENCOUNTER — Ambulatory Visit (HOSPITAL_BASED_OUTPATIENT_CLINIC_OR_DEPARTMENT_OTHER): Payer: Medicaid Other | Admitting: Anesthesiology

## 2015-02-17 ENCOUNTER — Ambulatory Visit (HOSPITAL_BASED_OUTPATIENT_CLINIC_OR_DEPARTMENT_OTHER)
Admission: RE | Admit: 2015-02-17 | Discharge: 2015-02-17 | Disposition: A | Discharge status: Home / Self Care | Payer: Medicaid Other | Source: Ambulatory Visit | Attending: Orthopedic Surgery | Admitting: Orthopedic Surgery

## 2015-02-17 ENCOUNTER — Encounter (HOSPITAL_BASED_OUTPATIENT_CLINIC_OR_DEPARTMENT_OTHER): Payer: Self-pay | Admitting: Anesthesiology

## 2015-02-17 DIAGNOSIS — M797 Fibromyalgia: Secondary | ICD-10-CM | POA: Diagnosis not present

## 2015-02-17 DIAGNOSIS — Z793 Long term (current) use of hormonal contraceptives: Secondary | ICD-10-CM | POA: Insufficient documentation

## 2015-02-17 DIAGNOSIS — Z79899 Other long term (current) drug therapy: Secondary | ICD-10-CM | POA: Diagnosis not present

## 2015-02-17 DIAGNOSIS — Z6841 Body Mass Index (BMI) 40.0 and over, adult: Secondary | ICD-10-CM | POA: Diagnosis not present

## 2015-02-17 DIAGNOSIS — R200 Anesthesia of skin: Secondary | ICD-10-CM | POA: Diagnosis present

## 2015-02-17 DIAGNOSIS — G5601 Carpal tunnel syndrome, right upper limb: Secondary | ICD-10-CM | POA: Diagnosis not present

## 2015-02-17 HISTORY — DX: Presence of spectacles and contact lenses: Z97.3

## 2015-02-17 HISTORY — PX: TRIGGER FINGER RELEASE: SHX641

## 2015-02-17 HISTORY — DX: Carpal tunnel syndrome, right upper limb: G56.01

## 2015-02-17 LAB — POCT I-STAT 4, (NA,K, GLUC, HGB,HCT)
Glucose, Bld: 106 mg/dL — ABNORMAL HIGH (ref 65–99)
HCT: 45 % (ref 36.0–46.0)
Hemoglobin: 15.3 g/dL — ABNORMAL HIGH (ref 12.0–15.0)
Potassium: 4.1 mmol/L (ref 3.5–5.1)
Sodium: 142 mmol/L (ref 135–145)

## 2015-02-17 LAB — POCT PREGNANCY, URINE: Preg Test, Ur: NEGATIVE

## 2015-02-17 SURGERY — RELEASE, A1 PULLEY, FOR TRIGGER FINGER
Anesthesia: Monitor Anesthesia Care | Site: Hand | Laterality: Right

## 2015-02-17 MED ORDER — SODIUM CHLORIDE 0.9 % IR SOLN
Status: DC | PRN
  Administered 2015-02-17: 500 mL

## 2015-02-17 MED ORDER — FENTANYL CITRATE (PF) 100 MCG/2ML IJ SOLN
25.0000 ug | INTRAMUSCULAR | Status: DC | PRN
  Filled 2015-02-17: qty 1

## 2015-02-17 MED ORDER — CEFAZOLIN SODIUM-DEXTROSE 2-3 GM-% IV SOLR
INTRAVENOUS | Status: AC
  Filled 2015-02-17: qty 50

## 2015-02-17 MED ORDER — DEXAMETHASONE SODIUM PHOSPHATE 4 MG/ML IJ SOLN
INTRAMUSCULAR | Status: DC | PRN
  Administered 2015-02-17: 4 mg via INTRAVENOUS

## 2015-02-17 MED ORDER — CHLORHEXIDINE GLUCONATE 4 % EX LIQD
60.0000 mL | Freq: Once | CUTANEOUS | Status: DC
  Filled 2015-02-17: qty 60

## 2015-02-17 MED ORDER — FENTANYL CITRATE (PF) 100 MCG/2ML IJ SOLN
INTRAMUSCULAR | Status: DC | PRN
  Administered 2015-02-17 (×8): 12.5 ug via INTRAVENOUS

## 2015-02-17 MED ORDER — ONDANSETRON HCL 4 MG/2ML IJ SOLN
INTRAMUSCULAR | Status: DC | PRN
  Administered 2015-02-17: 4 mg via INTRAVENOUS

## 2015-02-17 MED ORDER — MEPERIDINE HCL 25 MG/ML IJ SOLN
6.2500 mg | INTRAMUSCULAR | Status: DC | PRN
  Filled 2015-02-17: qty 1

## 2015-02-17 MED ORDER — MIDAZOLAM HCL 2 MG/2ML IJ SOLN
INTRAMUSCULAR | Status: AC
  Filled 2015-02-17: qty 4

## 2015-02-17 MED ORDER — LACTATED RINGERS IV SOLN
INTRAVENOUS | Status: DC
  Administered 2015-02-17: 12:00:00 via INTRAVENOUS
  Filled 2015-02-17: qty 1000

## 2015-02-17 MED ORDER — PROPOFOL 500 MG/50ML IV EMUL
INTRAVENOUS | Status: DC | PRN
  Administered 2015-02-17: 50 ug/kg/min via INTRAVENOUS

## 2015-02-17 MED ORDER — LIDOCAINE HCL (CARDIAC) 20 MG/ML IV SOLN
INTRAVENOUS | Status: DC | PRN
  Administered 2015-02-17: 40 mg via INTRAVENOUS

## 2015-02-17 MED ORDER — BUPIVACAINE HCL (PF) 0.25 % IJ SOLN
INTRAMUSCULAR | Status: DC | PRN
  Administered 2015-02-17: 10 mL

## 2015-02-17 MED ORDER — HYDROCODONE-ACETAMINOPHEN 5-300 MG PO TABS: 1.0000 | ORAL_TABLET | Freq: Four times a day (QID) | ORAL | Status: DC | PRN

## 2015-02-17 MED ORDER — LIDOCAINE HCL (PF) 1 % IJ SOLN
INTRAMUSCULAR | Status: DC | PRN
  Administered 2015-02-17: 10 mL

## 2015-02-17 MED ORDER — MIDAZOLAM HCL 5 MG/5ML IJ SOLN
INTRAMUSCULAR | Status: DC | PRN
  Administered 2015-02-17: 0.5 mg via INTRAVENOUS
  Administered 2015-02-17: 1 mg via INTRAVENOUS
  Administered 2015-02-17 (×2): 0.5 mg via INTRAVENOUS
  Administered 2015-02-17: 1 mg via INTRAVENOUS
  Administered 2015-02-17: 0.5 mg via INTRAVENOUS

## 2015-02-17 MED ORDER — FENTANYL CITRATE (PF) 100 MCG/2ML IJ SOLN
INTRAMUSCULAR | Status: AC
  Filled 2015-02-17: qty 4

## 2015-02-17 MED ORDER — CEFAZOLIN SODIUM-DEXTROSE 2-3 GM-% IV SOLR
2.0000 g | INTRAVENOUS | Status: AC
  Administered 2015-02-17: 2 g via INTRAVENOUS
  Filled 2015-02-17: qty 50

## 2015-02-17 MED ORDER — LACTATED RINGERS IV SOLN
INTRAVENOUS | Status: DC
  Filled 2015-02-17: qty 1000

## 2015-02-17 MED ORDER — PROMETHAZINE HCL 25 MG/ML IJ SOLN
6.2500 mg | INTRAMUSCULAR | Status: DC | PRN
  Filled 2015-02-17: qty 1

## 2015-02-17 SURGICAL SUPPLY — 37 items
BANDAGE ELASTIC 3 VELCRO ST LF (GAUZE/BANDAGES/DRESSINGS) ×2 IMPLANT
BLADE SURG 15 STRL LF DISP TIS (BLADE) ×1 IMPLANT
BLADE SURG 15 STRL SS (BLADE) ×2
BNDG CMPR 9X4 STRL LF SNTH (GAUZE/BANDAGES/DRESSINGS) ×1
BNDG CONFORM 3 STRL LF (GAUZE/BANDAGES/DRESSINGS) ×2 IMPLANT
BNDG ESMARK 4X9 LF (GAUZE/BANDAGES/DRESSINGS) ×2 IMPLANT
CORDS BIPOLAR (ELECTRODE) ×1 IMPLANT
COVER BACK TABLE 60X90IN (DRAPES) ×2 IMPLANT
COVER MAYO STAND STRL (DRAPES) ×1 IMPLANT
CUFF TOURNIQUET SINGLE 18IN (TOURNIQUET CUFF) IMPLANT
DRAPE EXTREMITY T 121X128X90 (DRAPE) ×2 IMPLANT
DRAPE LG THREE QUARTER DISP (DRAPES) ×2 IMPLANT
DRAPE SURG 17X23 STRL (DRAPES) ×2 IMPLANT
DRSG EMULSION OIL 3X3 NADH (GAUZE/BANDAGES/DRESSINGS) IMPLANT
GAUZE XEROFORM 1X8 LF (GAUZE/BANDAGES/DRESSINGS) ×2 IMPLANT
GLOVE BIO SURGEON STRL SZ8 (GLOVE) ×2 IMPLANT
GLOVE BIOGEL PI IND STRL 8.5 (GLOVE) ×1 IMPLANT
GLOVE BIOGEL PI INDICATOR 8.5 (GLOVE) ×1
GOWN STRL REUS W/ TWL LRG LVL3 (GOWN DISPOSABLE) ×2 IMPLANT
GOWN STRL REUS W/TWL LRG LVL3 (GOWN DISPOSABLE) ×4
NDL HYPO 25X1 1.5 SAFETY (NEEDLE) ×2 IMPLANT
NDL SAFETY ECLIPSE 18X1.5 (NEEDLE) IMPLANT
NEEDLE HYPO 18GX1.5 SHARP (NEEDLE)
NEEDLE HYPO 25X1 1.5 SAFETY (NEEDLE) ×4 IMPLANT
NS IRRIG 500ML POUR BTL (IV SOLUTION) ×2 IMPLANT
PACK BASIN DAY SURGERY FS (CUSTOM PROCEDURE TRAY) ×2 IMPLANT
PAD ALCOHOL SWAB (MISCELLANEOUS) ×8 IMPLANT
PAD CAST 3X4 CTTN HI CHSV (CAST SUPPLIES) IMPLANT
PADDING CAST COTTON 3X4 STRL (CAST SUPPLIES)
SPONGE GAUZE 4X4 12PLY STER LF (GAUZE/BANDAGES/DRESSINGS) ×2 IMPLANT
STOCKINETTE 4X48 STRL (DRAPES) ×2 IMPLANT
SUT PROLENE 4 0 PS 2 18 (SUTURE) ×2 IMPLANT
SYR BULB 3OZ (MISCELLANEOUS) ×2 IMPLANT
SYR CONTROL 10ML LL (SYRINGE) ×4 IMPLANT
TOWEL OR 17X24 6PK STRL BLUE (TOWEL DISPOSABLE) ×2 IMPLANT
TRAY DSU PREP LF (CUSTOM PROCEDURE TRAY) ×2 IMPLANT
UNDERPAD 30X30 INCONTINENT (UNDERPADS AND DIAPERS) ×2 IMPLANT

## 2015-02-17 NOTE — H&P (Signed)
Kristy Fowler is an 29 y.o. female.   Chief Complaint: right hand numbness and tingling HPI: Pt FOLLOWED IN OFFICE PT WITH PAIN AND TINGLING IN HAND PT HERE FOR SURGERY ON RIGHT HAND NO PRIOR SURGERY TO RIGHT HAND  Past Medical History  Diagnosis Date  . Fibromyalgia   . Carpal tunnel syndrome of right wrist   . Wears glasses     Past Surgical History  Procedure Laterality Date  . Reconstruction surgery's for left club foot  x8  total , from child to last one 2012    Family History  Problem Relation Age of Onset  . Hypertension Maternal Grandmother   . Hypertension Maternal Grandfather   . Stroke Maternal Grandfather   . Heart disease Maternal Grandfather   . Cancer Maternal Aunt     maternal great aunt  . Hypertension Mother   . Diabetes Mother    Social History:  reports that she has never smoked. She has never used smokeless tobacco. She reports that she does not drink alcohol or use illicit drugs.  Allergies: No Known Allergies  Medications Prior to Admission  Medication Sig Dispense Refill  . acetaminophen (TYLENOL) 500 MG tablet Take 500 mg by mouth every 6 (six) hours as needed.    . Cetirizine HCl (ZYRTEC PO) Take by mouth as needed.    . DULoxetine (CYMBALTA) 60 MG capsule Take 60 mg by mouth every evening.     . Norethin Ace-Eth Estrad-FE (MINASTRIN 24 FE) 1-20 MG-MCG(24) CHEW CHEW 1 TABLET BY MOUTH DAILY. (Patient taking differently: Chew 1 tablet by mouth every evening. CHEW 1 TABLET BY MOUTH DAILY.) 28 tablet 11    Results for orders placed or performed during the hospital encounter of 02/17/15 (from the past 48 hour(s))  Pregnancy, urine POC     Status: None   Collection Time: 02/17/15 10:58 AM  Result Value Ref Range   Preg Test, Ur NEGATIVE NEGATIVE    Comment:        THE SENSITIVITY OF THIS METHODOLOGY IS >24 mIU/mL   I-STAT 4, (NA,K, GLUC, HGB,HCT)     Status: Abnormal   Collection Time: 02/17/15 12:39 PM  Result Value Ref Range   Sodium  142 135 - 145 mmol/L   Potassium 4.1 3.5 - 5.1 mmol/L   Glucose, Bld 106 (H) 65 - 99 mg/dL   HCT 16.145.0 09.636.0 - 04.546.0 %   Hemoglobin 15.3 (H) 12.0 - 15.0 g/dL   No results found.  ROSNO RECENT ILLNESSES OR HOSPITALIZATIONS  Blood pressure 122/82, pulse 115, temperature 99.1 F (37.3 C), temperature source Oral, resp. rate 22, height 5' 3.5" (1.613 m), weight 108.183 kg (238 lb 8 oz), last menstrual period 02/09/2015, SpO2 98 %. Physical Exam  General Appearance:  Alert, cooperative, no distress, appears stated age  Head:  Normocephalic, without obvious abnormality, atraumatic  Eyes:  Pupils equal, conjunctiva/corneas clear,         Throat: Lips, mucosa, and tongue normal; teeth and gums normal  Neck: No visible masses     Lungs:   respirations unlabored  Chest Wall:  No tenderness or deformity  Heart:  Regular rate and rhythm,  Abdomen:   Soft, non-tender,         Extremities: RIGHT HAND: FINGERS WARM WELL PERFUSED ABLE TO PALMAR ABDUCT THUMB GOOD DIGITAL MOBILITY GOOD WRIST AND FOREARM ROTATION  Pulses: 2+ and symmetric  Skin: Skin color, texture, turgor normal, no rashes or lesions     Neurologic: Normal  Assessment/Plan RIGHT HAND CARPAL TUNNEL SYNDROME  RIGHT HAND CARPAL TUNNEL RELEASE  R/B/A DISCUSSED WITH PT IN OFFICE.  PT VOICED UNDERSTANDING OF PLAN CONSENT SIGNED DAY OF SURGERY PT SEEN AND EXAMINED PRIOR TO OPERATIVE PROCEDURE/DAY OF SURGERY SITE MARKED. QUESTIONS ANSWERED WILL GO HOME FOLLOWING SURGERY WE ARE PLANNING SURGERY FOR YOUR UPPER EXTREMITY. THE RISKS AND BENEFITS OF SURGERY INCLUDE BUT NOT LIMITED TO BLEEDING INFECTION, DAMAGE TO NEARBY NERVES ARTERIES TENDONS, FAILURE OF SURGERY TO ACCOMPLISH ITS INTENDED GOALS, PERSISTENT SYMPTOMS AND NEED FOR FURTHER SURGICAL INTERVENTION. WITH THIS IN MIND WE WILL PROCEED. I HAVE DISCUSSED WITH THE PATIENT THE PRE AND POSTOPERATIVE REGIMEN AND THE DOS AND DON'TS. PT VOICED UNDERSTANDING AND INFORMED CONSENT  SIGNED.   Sharma Covert 02/17/2015, 1:13 PM

## 2015-02-17 NOTE — Discharge Instructions (Signed)
KEEP BANDAGE CLEAN AND DRY CALL OFFICE FOR F/U APPT [phone] IN 14 DAYS KEEP HAND ELEVATED ABOVE HEART OK TO APPLY ICE TO OPERATIVE AREA CONTACT OFFICE IF ANY WORSENING PAIN OR CONCERNS.        HAND SURGERY    HOME CARE INSTRUCTIONS    The following instructions have been prepared to help you care for yourself upon your return home today.  Wound Care:  Keep your hand elevated above the level of your heart. Do not allow it to dangle by your side. Keep the dressing dry and do not remove it unless your doctor advises you to do so. He will usually change it at the time of you post-op visit. Moving your fingers is advised to stimulate circulation but will depend on the site of your surgery. Of course, if you have a splint applied your doctor will advise you about movement.  Activity:  Do not drive or operate machinery today. Rest today and then you may return to your normal activity and work as indicated by your physician.  Diet: Drink liquids today or eat a light diet. You may resume a regular diet tomorrow.  General expectations: Pain for two or three days. Fingers may become slightly swollen.   Unexpected Observations- Call your doctor if any of these occur: Severe pain not relieved by pain medication. Elevated temperature. Dressing soaked with blood. Inability to move fingers. White or bluish color to fingers.      Post Anesthesia Home Care Instructions  Activity: Get plenty of rest for the remainder of the day. A responsible adult should stay with you for 24 hours following the procedure.  For the next 24 hours, DO NOT: -Drive a car -Advertising copywriterperate machinery -Drink alcoholic beverages -Take any medication unless instructed by your physician -Make any legal decisions or sign important papers.  Meals: Start with liquid foods such as gelatin or soup. Progress to regular foods as tolerated. Avoid greasy, spicy, heavy foods. If nausea and/or vomiting occur, drink only clear  liquids until the nausea and/or vomiting subsides. Call your physician if vomiting continues.  Special Instructions/Symptoms: Your throat may feel dry or sore from the anesthesia or the breathing tube placed in your throat during surgery. If this causes discomfort, gargle with warm salt water. The discomfort should disappear within 24 hours.  If you had a scopolamine patch placed behind your ear for the management of post- operative nausea and/or vomiting:  1. The medication in the patch is effective for 72 hours, after which it should be removed.  Wrap patch in a tissue and discard in the trash. Wash hands thoroughly with soap and water. 2. You may remove the patch earlier than 72 hours if you experience unpleasant side effects which may include dry mouth, dizziness or visual disturbances. 3. Avoid touching the patch. Wash your hands with soap and water after contact with the patch.

## 2015-02-17 NOTE — Anesthesia Preprocedure Evaluation (Addendum)
Anesthesia Evaluation  Patient identified by MRN, date of birth, ID band Patient awake    Reviewed: Allergy & Precautions, H&P , NPO status , Patient's Chart, lab work & pertinent test results  Airway Mallampati: II  TM Distance: >3 FB Neck ROM: full    Dental no notable dental hx. (+) Dental Advisory Given, Teeth Intact   Pulmonary neg pulmonary ROS,  breath sounds clear to auscultation  Pulmonary exam normal       Cardiovascular Exercise Tolerance: Good negative cardio ROS Normal cardiovascular examRhythm:regular Rate:Normal     Neuro/Psych negative neurological ROS  negative psych ROS   GI/Hepatic negative GI ROS, Neg liver ROS,   Endo/Other  negative endocrine ROSMorbid obesity  Renal/GU negative Renal ROS  negative genitourinary   Musculoskeletal   Abdominal (+) + obese,   Peds  Hematology negative hematology ROS (+)   Anesthesia Other Findings   Reproductive/Obstetrics negative OB ROS                            Anesthesia Physical Anesthesia Plan  ASA: III  Anesthesia Plan: MAC   Post-op Pain Management:    Induction:   Airway Management Planned: Simple Face Mask  Additional Equipment:   Intra-op Plan:   Post-operative Plan:   Informed Consent: I have reviewed the patients History and Physical, chart, labs and discussed the procedure including the risks, benefits and alternatives for the proposed anesthesia with the patient or authorized representative who has indicated his/her understanding and acceptance.   Dental Advisory Given  Plan Discussed with: CRNA and Surgeon  Anesthesia Plan Comments:         Anesthesia Quick Evaluation

## 2015-02-17 NOTE — Transfer of Care (Signed)
Immediate Anesthesia Transfer of Care Note  Patient: Kristy Fowler  Procedure(s) Performed: Procedure(s) (LRB): RIGHT CARPAL TUNNEL RELEASE (Right)  Patient Location: PACU  Anesthesia Type: MAC  Level of Consciousness: awake, sedated, patient cooperative and responds to stimulation  Airway & Oxygen Therapy: Patient Spontanous Breathing and Patient connected to face mask oxygen  Post-op Assessment: Report given to PACU RN, Post -op Vital signs reviewed and stable and Patient moving all extremities  Post vital signs: Reviewed and stable  Complications: No apparent anesthesia complications

## 2015-02-17 NOTE — Anesthesia Procedure Notes (Signed)
Procedure Name: MAC Date/Time: 02/17/2015 1:20 PM Performed by: Jessica PriestBEESON, Demico Ploch C Pre-anesthesia Checklist: Patient identified, Timeout performed, Emergency Drugs available, Suction available and Patient being monitored Patient Re-evaluated:Patient Re-evaluated prior to inductionOxygen Delivery Method: Simple face mask Preoxygenation: Pre-oxygenation with 100% oxygen Intubation Type: IV induction Placement Confirmation: positive ETCO2 and breath sounds checked- equal and bilateral

## 2015-02-17 NOTE — Anesthesia Postprocedure Evaluation (Signed)
  Anesthesia Post-op Note  Patient: Kristy Fowler  Procedure(s) Performed: Procedure(s) (LRB): RIGHT CARPAL TUNNEL RELEASE (Right)  Patient Location: PACU  Anesthesia Type: MAC  Level of Consciousness: awake and alert   Airway and Oxygen Therapy: Patient Spontanous Breathing  Post-op Pain: mild  Post-op Assessment: Post-op Vital signs reviewed, Patient's Cardiovascular Status Stable, Respiratory Function Stable, Patent Airway and No signs of Nausea or vomiting  Last Vitals:  Filed Vitals:   02/17/15 1400  BP: 110/68  Pulse: 105  Temp:   Resp: 11    Post-op Vital Signs: stable   Complications: No apparent anesthesia complications

## 2015-02-17 NOTE — Op Note (Signed)
OPERATIVE REPORT  PREOPERATIVE DIAGNOSIS: Right hand severe carpal tunnel syndrome.  POSTOPERATIVE DIAGNOSIS: Right hand severe carpal tunnel syndrome.  SURGICAL PROCEDURE: Right hand carpal tunnel release.  ATTENDING PHYSICIAN: Sharma CovertFred W. Merlyn Bollen, MD, who scrubbed and present for the entire procedure.  ASSISTANT SURGEON: None.  ANESTHESIA: Xylocaine 1% and 0.25% Marcaine local block with IV sedation.  SURGICAL INDICATIONS: The patient is a right-hand-dominant female, who is followed in the office with severe carpal tunnel syndrome. The patient has failed nonsurgical treatment and elected to undergo the above procedure. Risks, benefits, and alternatives were discussed in detail with the patient and signed informed consent was obtained. Risks include, but not limited to bleeding, infection, damage to nearby nerves, arteries or tendons, loss of motion to the wrist and digits, incomplete relief of symptoms, and need for further surgical intervention.  DESCRIPTION OF PROCEDURE: The patient was properly identified in the preoperative holding area and a mark with a permanent marker made on the right hand to indicate the correct operative site. The patient was then brought back to the operating room, placed supine on the anesthesia room table where the IV sedation was administered. The patient tolerated this well. Xylocaine 1% and 0.25% Marcaine local block was then performed. The patient tolerated this well. A well-padded tourniquet was placed in left brachium and sealed with 1000 drape. Left upper extremity was then prepped and draped in normal sterile fashion. Time- out was called, correct side was identified, and procedure then begun. Attention then turned to left hand. Limb was elevated and tourniquet insufflated. Several cm incision made directly in mid palm. Dissection was carried down through the skin and subcutaneous  tissue. The palmar fascia was incised longitudinally. Deep dissection carried onto the transverse carpal ligament and under direct visualization, the distal one-half of the transverse carpal ligament released. Further exposure was then carried out approximating first the transverse carpal ligament as well as the portion of the antebrachial fascia was then released. Contents of carpal canal were then inspected. No other abnormalities noted. The tourniquet was deflated. Hemostasis was obtained. Skin was then closed using horizontal mattress Prolene sutures. Xeroform dressing and sterile compressive bandage were then applied. The patient tolerated the procedure well and returned to the recovery room in good condition.  POSTPROCEDURAL PLAN: The patient discharged to home, seen back in the office in approximately 2 weeks for wound check, suture removal, and Gel Flex Glove. Begin postoperative carpal tunnel evaluation and treat.

## 2015-02-21 ENCOUNTER — Encounter (HOSPITAL_BASED_OUTPATIENT_CLINIC_OR_DEPARTMENT_OTHER): Payer: Self-pay | Admitting: Orthopedic Surgery

## 2016-11-13 ENCOUNTER — Encounter: Payer: Medicaid Other | Attending: Family Medicine | Admitting: Nutrition

## 2016-11-13 ENCOUNTER — Encounter: Payer: Self-pay | Admitting: Nutrition

## 2016-11-13 VITALS — Ht 64.0 in | Wt 249.0 lb

## 2016-11-13 DIAGNOSIS — IMO0002 Reserved for concepts with insufficient information to code with codable children: Secondary | ICD-10-CM

## 2016-11-13 DIAGNOSIS — E669 Obesity, unspecified: Secondary | ICD-10-CM

## 2016-11-13 DIAGNOSIS — E1165 Type 2 diabetes mellitus with hyperglycemia: Secondary | ICD-10-CM | POA: Insufficient documentation

## 2016-11-13 DIAGNOSIS — E118 Type 2 diabetes mellitus with unspecified complications: Secondary | ICD-10-CM

## 2016-11-13 DIAGNOSIS — Z713 Dietary counseling and surveillance: Secondary | ICD-10-CM | POA: Diagnosis not present

## 2016-11-13 NOTE — Patient Instructions (Signed)
Goals 1. Follow My Plate 2. Eat meals on time 3. Eat 2-3 carb choices per meal 4. Cut out sweet tea and only drink water 5. Exercise 30 minutes a day 6. Test blood sugars twice a day. 7. Lose 1-2 lbs per week Take Metformin after supper. Get A1C less than 6%.

## 2016-11-13 NOTE — Progress Notes (Signed)
Diabetes Self-Management Education  Visit Type: First/Initial  Appt. Start Time: 0800  Appt. End Time: 930  11/13/2016  Ms. Kristy Fowler, identified by name and date of birth, is a 31 y.o. female with a diagnosis of Diabetes: Type 2. Lives with her boyfriend and 2 kids. She does all the cooking and shopping. Taking 500 mg of Metformin daily. Wants to lose weight and improve DM. Has cut out eating a lot of junk food and snacks. Eating healthier foods. Trying not to skip meals. A1c 7.9%. Diet is excessive to meet her needs as evidenced by A1C 7.9% and BMI >30.  ASSESSMENT  Height  (1.626 m), weight 249 lb (112.9 kg). Body mass index is 42.74 kg/m.      Diabetes Self-Management Education - 11/13/16 0804      Visit Information   Visit Type First/Initial     Initial Visit   Diabetes Type Type 2   Are you currently following a meal plan? No   Are you taking your medications as prescribed? Yes   Date Diagnosed April 2018     Health Coping   How would you rate your overall health? Good     Psychosocial Assessment   Patient Belief/Attitude about Diabetes Motivated to manage diabetes   Self-care barriers None   Self-management support Family   Other persons present Patient;Family Member   Patient Concerns Nutrition/Meal planning;Medication;Monitoring;Healthy Lifestyle;Glycemic Control   Special Needs None   Learning Readiness Change in progress   How often do you need to have someone help you when you read instructions, pamphlets, or other written materials from your doctor or pharmacy? 1 - Never   What is the last grade level you completed in school? GED     Complications   How often do you check your blood sugar? 1-2 times/day   Fasting Blood glucose range (mg/dL) 16-109   Postprandial Blood glucose range (mg/dL) 604-540   Number of hypoglycemic episodes per month 0   Number of hyperglycemic episodes per week 2   Can you tell when your blood sugar is high? No    Have you had a dilated eye exam in the past 12 months? Yes   Have you had a dental exam in the past 12 months? Yes   Are you checking your feet? No     Dietary Intake   Breakfast skips;   Lunch [place] sandkwich white bread swiss cheese and mayo, Water or sweet tea   Snack (afternoon) cheetos, water   [employer] 2 slices-chicken, cheese, mushrooms, spinach,  cool whip, Sweet tea   Snack (evening) sometimes: fruit   Beverage(s) water, sweet tea, kooilaid SF     Exercise   Exercise Type ADL's     Patient Education   Previous Diabetes Education No   Disease state  Definition of diabetes, type 1 and 2, and the diagnosis of diabetes;Factors that contribute to the development of diabetes;Explored patient's options for treatment of their diabetes   Nutrition management  Role of diet in the treatment of diabetes and the relationship between the three main macronutrients and blood glucose level;Food label reading, portion sizes and measuring food.;Carbohydrate counting;Reviewed blood glucose goals for pre and post meals and how to evaluate the patients' food intake on their blood glucose level.;Meal timing in regards to the patients' current diabetes medication.;Effects of alcohol on blood glucose and safety factors with consumption of alcohol.;Information on hints to eating out and maintain blood glucose control.;Meal options for control of blood glucose  level and chronic complications.   Physical activity and exercise  Role of exercise on diabetes management, blood pressure control and cardiac health.   Medications Reviewed patients medication for diabetes, action, purpose, timing of dose and side effects.;Reviewed medication adjustment guidelines for hyperglycemia and sick days.   Monitoring Purpose and frequency of SMBG.;Taught/discussed recording of test results and interpretation of SMBG.;Interpreting lab values - A1C, lipid, urine microalbumina.;Identified appropriate SMBG and/or A1C goals.;Daily  foot exams;Yearly dilated eye exam   Acute complications Taught treatment of hypoglycemia - the 15 rule.;Discussed and identified patients' treatment of hyperglycemia.;Covered sick day management with medication and food.   Chronic complications Relationship between chronic complications and blood glucose control;Lipid levels, blood glucose control and heart disease;Identified and discussed with patient  current chronic complications;Nephropathy, what it is, prevention of, the use of ACE, ARB's and early detection of through urine microalbumia.;Retinopathy and reason for yearly dilated eye exams;Reviewed with patient heart disease, higher risk of, and prevention   Psychosocial adjustment Role of stress on diabetes;Identified and addressed patients feelings and concerns about diabetes;Brainstormed with patient on coping mechanisms for social situations, getting support from significant others, dealing with feelings about diabetes;Helped patient identify a support system for diabetes management   Personal strategies to promote health Lifestyle issues that need to be addressed for better diabetes care;Review risk of smoking and offered smoking cessation;Helped patient develop diabetes management plan for (enter comment)     Individualized Goals (developed by patient)   Nutrition Follow meal plan discussed;Adjust meds/carbs with exercise as discussed;General guidelines for healthy choices and portions discussed   Physical Activity Exercise 3-5 times per week;30 minutes per day   Medications take my medication as prescribed   Monitoring  test my blood glucose as discussed   Reducing Risk examine blood glucose patterns;do foot checks daily;increase portions of healthy fats;treat hypoglycemia with 15 grams of carbs if blood glucose less than /dL     Post-Education Assessment   Patient understands the diabetes disease and treatment process. Needs Review   Patient understands incorporating nutritional  management into lifestyle. Needs Review   Patient undertands incorporating physical activity into lifestyle. Needs Review   Patient understands using medications safely. Demonstrates understanding / competency   Patient understands monitoring blood glucose, interpreting and using results Needs Review   Patient understands prevention, detection, and treatment of acute complications. Needs Review   Patient understands prevention, detection, and treatment of chronic complications. Needs Review   Patient understands how to develop strategies to address psychosocial issues. Needs Review   Patient understands how to develop strategies to promote health/change behavior. Needs Review     Outcomes   Expected Outcomes Demonstrated interest in learning. Expect positive outcomes   Future DMSE 4-6 wks   Program Status Completed      Individualized Plan for Diabetes Self-Management Training:   Learning Objective:  Patient will have a greater understanding of diabetes self-management. Patient education plan is to attend individual and/or group sessions per assessed needs and concerns.   Plan:  Goals 1. Follow My Plate 2. Eat meals on time 3. Eat 2-3 carb choices per meal 4. Cut out sweet tea and only drink water 5. Exercise 30 minutes a day 6. Test blood sugars twice a day. 7. Lose 1-2 lbs per week Take Metformin after supper. Get A1C less than 6%.  Expected Outcomes:  Demonstrated interest in learning. Expect positive outcomes  Education material provided: Living Well with Diabetes, Food label handouts, A1C conversion sheet, Meal plan card, My  Plate and Carbohydrate counting sheet  If problems or questions, patient to contact team via:  Phone and Email  Future DSME appointment: 4-6 wks

## 2016-12-19 ENCOUNTER — Other Ambulatory Visit (HOSPITAL_COMMUNITY)
Admission: RE | Admit: 2016-12-19 | Discharge: 2016-12-19 | Disposition: A | Discharge status: Home / Self Care | Payer: Medicaid Other | Source: Ambulatory Visit | Attending: Adult Health | Admitting: Adult Health

## 2016-12-19 ENCOUNTER — Ambulatory Visit (INDEPENDENT_AMBULATORY_CARE_PROVIDER_SITE_OTHER): Payer: Medicaid Other | Admitting: Adult Health

## 2016-12-19 ENCOUNTER — Encounter: Payer: Self-pay | Admitting: Adult Health

## 2016-12-19 VITALS — BP 110/70 | HR 74 | Ht 64.0 in | Wt 244.0 lb

## 2016-12-19 DIAGNOSIS — Z01419 Encounter for gynecological examination (general) (routine) without abnormal findings: Secondary | ICD-10-CM | POA: Diagnosis not present

## 2016-12-19 DIAGNOSIS — N926 Irregular menstruation, unspecified: Secondary | ICD-10-CM | POA: Insufficient documentation

## 2016-12-19 NOTE — Patient Instructions (Signed)
Keep period calendar 

## 2016-12-19 NOTE — Addendum Note (Signed)
Addended by: Federico FlakeNES, Eylin Pontarelli A on: 12/19/2016 11:28 AM   Modules accepted: Orders

## 2016-12-19 NOTE — Progress Notes (Signed)
Patient ID: Kristy Fowler, female   DOB: July 23, 1986, 31 y.o.   MRN: [id number] History of Present Illness:  Kristy Fowler is a white female, married, G3 P2, in for well woman gyn exam and pap.She has been diagnosed with diabetes recently and is on metformin. She stopped Cymbalta and OCs and periods not regular, is OK if gets pregnant.  PCP is Maurice SmallElaine Madgie Dhaliwal.   Current Medications, Allergies, Past Medical History, Past Surgical History, Family History and Social History were reviewed in [facility] electronic medical record.     Review of Systems: Patient denies any headaches, hearing loss, fatigue, blurred vision, shortness of breath, chest pain, abdominal pain, problems with bowel movements, urination, or intercourse. No joint pain or mood swings. Occasional low back pain, and periods not regular    Physical Exam: General:  Well developed, well nourished, no acute distress Skin:  Warm and dry Neck:  Midline trachea, normal thyroid, good ROM, no lymphadenopathy Lungs; Clear to auscultation bilaterally Breast:  No dominant palpable mass, retraction, or nipple discharge Cardiovascular: Regular rate and rhythm Abdomen:  Soft, non tender, no hepatosplenomegaly Pelvic:  External genitalia is normal in appearance, no lesions.  The vagina is normal in appearance, period like blood. Urethra has no lesions or masses. The cervix is bulbous,pap with HPV performed.Marland Kitchen.  Uterus is felt to be normal size, shape, and contour.  No adnexal masses or tenderness noted.Bladder is non tender, no masses felt Extremities/musculoskeletal:  No swelling or varicosities noted, no clubbing or cyanosis Psych:  No mood changes, alert and cooperative,seems happy PHQ 2 score 0.  Impression:  1. Encounter for routine gynecological examination with Papanicolaou smear of cervix   2. Irregular periods      Plan: Keep period calendar Take PNV with folic acid Physical in 1 year Pap in 3 years if normal Labs with  PCP

## 2016-12-20 LAB — CYTOLOGY - PAP
Diagnosis: NEGATIVE
HPV: NOT DETECTED

## 2017-01-30 ENCOUNTER — Ambulatory Visit: Payer: Medicaid Other | Admitting: Nutrition

## 2017-02-13 ENCOUNTER — Ambulatory Visit: Payer: Medicaid Other | Admitting: Nutrition

## 2017-03-07 ENCOUNTER — Telehealth: Payer: Self-pay | Admitting: Adult Health

## 2017-03-07 MED ORDER — NORETHIN-ETH ESTRAD-FE BIPHAS 1 MG-10 MCG / 10 MCG PO TABS: 1.0000 | ORAL_TABLET | Freq: Every day | ORAL | 11 refills | Status: DC

## 2017-03-07 NOTE — Telephone Encounter (Signed)
Pt wants to get back on birth control ,will rx lo loestrin

## 2017-03-07 NOTE — Telephone Encounter (Signed)
[facility] called [facility]. Please contact pt when sent

## 2018-02-07 DIAGNOSIS — M797 Fibromyalgia: Secondary | ICD-10-CM | POA: Diagnosis not present

## 2018-02-07 DIAGNOSIS — E1165 Type 2 diabetes mellitus with hyperglycemia: Secondary | ICD-10-CM | POA: Diagnosis not present

## 2018-02-07 DIAGNOSIS — K589 Irritable bowel syndrome without diarrhea: Secondary | ICD-10-CM | POA: Diagnosis not present

## 2018-02-07 DIAGNOSIS — Z0000 Encounter for general adult medical examination without abnormal findings: Secondary | ICD-10-CM | POA: Diagnosis not present

## 2019-01-05 DIAGNOSIS — F418 Other specified anxiety disorders: Secondary | ICD-10-CM | POA: Diagnosis not present

## 2019-01-13 DIAGNOSIS — F330 Major depressive disorder, recurrent, mild: Secondary | ICD-10-CM | POA: Diagnosis not present

## 2019-01-20 DIAGNOSIS — F330 Major depressive disorder, recurrent, mild: Secondary | ICD-10-CM | POA: Diagnosis not present

## 2019-01-27 DIAGNOSIS — F418 Other specified anxiety disorders: Secondary | ICD-10-CM | POA: Diagnosis not present

## 2019-01-27 DIAGNOSIS — E1165 Type 2 diabetes mellitus with hyperglycemia: Secondary | ICD-10-CM | POA: Diagnosis not present

## 2019-01-27 DIAGNOSIS — F330 Major depressive disorder, recurrent, mild: Secondary | ICD-10-CM | POA: Diagnosis not present

## 2019-02-03 DIAGNOSIS — F330 Major depressive disorder, recurrent, mild: Secondary | ICD-10-CM | POA: Diagnosis not present

## 2019-02-16 ENCOUNTER — Other Ambulatory Visit: Payer: Self-pay | Admitting: Adult Health

## 2019-02-17 ENCOUNTER — Telehealth: Payer: Self-pay | Admitting: Obstetrics and Gynecology

## 2019-02-17 DIAGNOSIS — F330 Major depressive disorder, recurrent, mild: Secondary | ICD-10-CM | POA: Diagnosis not present

## 2019-02-17 NOTE — Telephone Encounter (Signed)

## 2019-02-18 ENCOUNTER — Other Ambulatory Visit (HOSPITAL_COMMUNITY)
Admission: RE | Admit: 2019-02-18 | Discharge: 2019-02-18 | Disposition: A | Discharge status: Home / Self Care | Payer: BC Managed Care – PPO | Source: Ambulatory Visit | Attending: Adult Health | Admitting: Adult Health

## 2019-02-18 ENCOUNTER — Encounter: Payer: Self-pay | Admitting: Obstetrics and Gynecology

## 2019-02-18 ENCOUNTER — Ambulatory Visit (INDEPENDENT_AMBULATORY_CARE_PROVIDER_SITE_OTHER): Payer: BC Managed Care – PPO | Admitting: Obstetrics and Gynecology

## 2019-02-18 ENCOUNTER — Other Ambulatory Visit: Payer: Self-pay

## 2019-02-18 VITALS — BP 121/80 | HR 82 | Ht 64.0 in | Wt 208.4 lb

## 2019-02-18 DIAGNOSIS — Z01419 Encounter for gynecological examination (general) (routine) without abnormal findings: Secondary | ICD-10-CM | POA: Diagnosis not present

## 2019-02-18 NOTE — Progress Notes (Signed)
Patient ID: Kristy Fowler, female   DOB: 1985-09-25, 33 y.o.   MRN: [id number]   Assessment:  Annual Gyn Exam DM Dyspareunia, single episode resolved Plan:  1. pap smear done, next pap due 5 years 2. return annually or prn 3    Annual mammogram advised after age 76 Subjective:  Kristy Fowler is a 33 y.o. female [phone] who presents for annual exam. Patient's last menstrual period was 01/23/2019 (exact date). The patient has complaints today of single episode of pain during intercourse a few monthsback, describes pain as intense as labor pains. Has had sex without any problems since then. Has diabetes,no GDM, on Metformin 500 mg daily blood sugars in morning between 100-120. Recently started Zoloft and had questions about increased cervical discharge due to medication.  The following portions of the patient's history were reviewed and updated as appropriate: allergies, current medications, past family history, past medical history, past social history, past surgical history and problem list. Past Medical History:  Diagnosis Date  . Carpal tunnel syndrome of right wrist   . Diabetes mellitus without complication ([place])   . Fibromyalgia   . Wears glasses     Past Surgical History:  Procedure Laterality Date  . RECONSTRUCTION SURGERY'S FOR LEFT CLUB FOOT  x8  total , from child to last one 2012  . TRIGGER FINGER RELEASE Right 02/17/2015   Procedure: RIGHT CARPAL TUNNEL RELEASE;  Surgeon: Iran Planas, MD;  Location: [facility];  Service: Orthopedics;  Laterality: Right;     Current Outpatient Medications:  .  atorvastatin (LIPITOR) 20 MG tablet, Take 20 mg by mouth once a week., Disp: , Rfl:  .  metFORMIN (GLUCOPHAGE) 500 MG tablet, Take 500 mg by mouth 2 (two) times daily with a meal., Disp: , Rfl:  .  sertraline (ZOLOFT) 50 MG tablet, TAKE 1 TABLET BY MOUTH ONCE DAILY FOR 30 DAYS, Disp: , Rfl:   Review of Systems Constitutional: negative Gastrointestinal:  negative Genitourinary: normal  Objective:  BP 121/80 (BP Location: Left Arm, Patient Position: Sitting, Cuff Size: Normal)   Pulse 82   Ht 5\' 4"  (1.626 m)   Wt 208 lb 6.4 oz (94.5 kg)   LMP 01/23/2019 (Exact Date)   BMI 35.77 kg/m    BMI: Body mass index is 35.77 kg/m.  General Appearance: Alert, appropriate appearance for age. No acute distress HEENT: Grossly normal Neck / Thyroid:  Cardiovascular: RRR; normal S1, S2, no murmur Lungs: CTA bilaterally Back: No CVAT Breast Exam: No masses or nodes.No dimpling, nipple retraction or discharge. Gastrointestinal: Soft, non-tender, no masses or organomegaly Pelvic Exam:  VAGINA: normal appearing vagina with normal color and discharge, no lesions,  CERVIX: normal appearing cervix without discharge or lesions,  UTERUS: uterus is normal size, shape, consistency and nontender, ovaries normal PAP: Pap smear done today. Lymphatic Exam: Non-palpable nodes in neck, clavicular, axillary, or inguinal regions  Skin: no rash or abnormalities Neurologic: Normal gait and speech, no tremor  Psychiatric: Alert and oriented, appropriate affect.  Urinalysis:Not done  By signing my name below, I, Samul Dada, attest that this documentation has been prepared under the direction and in the presence of Jonnie Kind, MD. Electronically Signed: [place]. 02/18/19. 8:49 AM.  I personally performed the services described in this documentation, which was SCRIBED in my presence. The recorded information has been reviewed and considered accurate. It has been edited as necessary during review. Jonnie Kind, MD

## 2019-02-18 NOTE — Addendum Note (Signed)
Addended by: Christiana Pellant A on: 02/18/2019 11:50 AM   Modules accepted: Orders

## 2019-02-20 LAB — CYTOLOGY - PAP
Diagnosis: NEGATIVE
HPV: NOT DETECTED

## 2019-02-24 DIAGNOSIS — F330 Major depressive disorder, recurrent, mild: Secondary | ICD-10-CM | POA: Diagnosis not present

## 2019-03-03 DIAGNOSIS — F330 Major depressive disorder, recurrent, mild: Secondary | ICD-10-CM | POA: Diagnosis not present

## 2019-03-10 DIAGNOSIS — F330 Major depressive disorder, recurrent, mild: Secondary | ICD-10-CM | POA: Diagnosis not present

## 2019-03-24 DIAGNOSIS — F330 Major depressive disorder, recurrent, mild: Secondary | ICD-10-CM | POA: Diagnosis not present

## 2019-04-14 DIAGNOSIS — F330 Major depressive disorder, recurrent, mild: Secondary | ICD-10-CM | POA: Diagnosis not present

## 2019-05-18 DIAGNOSIS — E1165 Type 2 diabetes mellitus with hyperglycemia: Secondary | ICD-10-CM | POA: Diagnosis not present

## 2019-05-18 DIAGNOSIS — E7800 Pure hypercholesterolemia, unspecified: Secondary | ICD-10-CM | POA: Diagnosis not present

## 2019-05-18 DIAGNOSIS — F418 Other specified anxiety disorders: Secondary | ICD-10-CM | POA: Diagnosis not present

## 2019-05-18 DIAGNOSIS — Z23 Encounter for immunization: Secondary | ICD-10-CM | POA: Diagnosis not present

## 2019-07-22 ENCOUNTER — Encounter: Payer: Self-pay | Admitting: *Deleted

## 2019-07-22 ENCOUNTER — Other Ambulatory Visit: Payer: Self-pay

## 2019-07-22 ENCOUNTER — Ambulatory Visit (INDEPENDENT_AMBULATORY_CARE_PROVIDER_SITE_OTHER): Payer: BC Managed Care – PPO | Admitting: *Deleted

## 2019-07-22 VITALS — BP 134/79 | HR 98 | Ht 64.0 in | Wt 222.0 lb

## 2019-07-22 DIAGNOSIS — Z3201 Encounter for pregnancy test, result positive: Secondary | ICD-10-CM | POA: Diagnosis not present

## 2019-07-22 LAB — POCT URINE PREGNANCY: Preg Test, Ur: POSITIVE — AB

## 2019-07-22 MED ORDER — PRENATAL PLUS 27-1 MG PO TABS: 1.0000 | ORAL_TABLET | Freq: Every day | ORAL | 12 refills | Status: DC

## 2019-07-22 NOTE — Progress Notes (Signed)
Chart reviewed for nurse visit. Agree with plan of care. Rx prenatal plus 1 daily  Estill Dooms, NP 07/22/2019 2:46 PM

## 2019-07-22 NOTE — Progress Notes (Signed)
   NURSE VISIT- PREGNANCY CONFIRMATION   SUBJECTIVE:  Kristy Fowler is a 33 y.o. [phone] female at [redacted]w[redacted]d by certain LMP of Patient's last menstrual period was 06/12/2019 (exact date). Here for pregnancy confirmation.  Home pregnancy test: positive x one  She reports nausea.  She is not taking prenatal vitamins.    OBJECTIVE:  BP 134/79 (BP Location: Left Arm, Patient Position: Sitting, Cuff Size: Normal)   Pulse 98   Ht 5\' 4"  (1.626 m)   Wt 222 lb (100.7 kg)   LMP 06/12/2019 (Exact Date)   BMI 38.11 kg/m   Appears well, in no apparent distress OB History  Gravida Para Term Preterm AB Living  4 2 2   1 2   SAB TAB Ectopic Multiple Live Births    1     2    # Outcome Date GA Lbr Len/2nd Weight Sex Delivery Anes PTL Lv  4 Current           3 Term 11/21/12 [redacted]w[redacted]d 05:09 / 00:08 8 lb 5 oz (3.77 kg) F Vag-Spont None  LIV  2 TAB 2008 [redacted]w[redacted]d         1 Term 2006 [redacted]w[redacted]d   M Vag-Spont None  LIV     Birth Comments: IOL for HTN    Results for orders placed or performed in visit on 07/22/19 (from the past 24 hour(s))  POCT urine pregnancy   Collection Time: 07/22/19  2:37 PM  Result Value Ref Range   Preg Test, Ur Positive (A) Negative    ASSESSMENT: Positive pregnancy test, [redacted]w[redacted]d by LMP    PLAN: Schedule for dating ultrasound in 3 weeks. Prenatal vitamins: note routed to Derrek Monaco, NP to send prescription   Nausea medicines: not currently needed   OB packet given: No  Rolena Infante  07/22/2019 2:41 PM

## 2019-07-22 NOTE — Addendum Note (Signed)
Addended by: Derrek Monaco A on: 07/22/2019 02:46 PM   Modules accepted: Orders

## 2019-08-07 NOTE — L&D Delivery Note (Addendum)
[facility] Delivery Note  AUDRYNA WENDT is a 34 y.o. [id number] s/p VD at 107w2d. She was admitted for IOL for gHTN and GDMA2.   ROM: 1h 62m with pink fluid with light meconium GBS Status:  Negative/-- (07/21 1330) Maximum Maternal Temperature:  Temp (24hrs), Avg:98.6 F (37 C), Min:98.5 F (36.9 C), Max:98.7 F (37.1 C)  Labor Progress: Patient arrived at 2.5 cm dilation and was induced with one buccal dose of Cytotec.   Delivery Date/Time: 03/06/2020 at 23:12 Delivery: Called to room and patient was complete and pushing. Head delivered in LOA position. Nuchal x1 and body x1. With delivery of head minimal movement of body after next push. Shoulder dystocia was identified; the bed was laid flat, 1st maneuver was McRoberts, 2nd maneuver was delivery of posterior arm. Body delivered in usual fashion after. Infant with spontaneous cry, placed on mother's abdomen, dried and stimulated. Cord clamped x 2 after 1-minute delay, and cut by FOB. Cord blood drawn. Placenta delivered spontaneously with gentle cord traction. Fundus firm with massage and Pitocin. Labia, perineum, vagina, and cervix inspected with no lacerations.   Placenta: intact, 3V Complications: Shoulder dystocia, less than 30 seconds Lacerations: None EBL: Analgesia: Epidural   Infant: Female Infant  APGAR (1 MIN): 8   APGAR (5 MINS): 9   APGAR (10 MINS):    Weight: pending  Evelena Leyden, DO PGY-1 Family Medicine   I was gloved and present for delivery in its entirety.  Second stage of labor progressed, baby delivered after 3 contractions. With delivery of head, shoulder dystocia identified. CNM assumed care of delivery at that point. Patient laid flat and McRoberts performed. Posterior arm delivered and body delivered easily after. Total time less than 30 seconds.   Lacerations: none  EBL:  Rolm Bookbinder, CNM 1:31 AM

## 2019-08-11 ENCOUNTER — Other Ambulatory Visit: Payer: Self-pay | Admitting: Obstetrics & Gynecology

## 2019-08-11 DIAGNOSIS — O3680X0 Pregnancy with inconclusive fetal viability, not applicable or unspecified: Secondary | ICD-10-CM

## 2019-08-12 ENCOUNTER — Ambulatory Visit (INDEPENDENT_AMBULATORY_CARE_PROVIDER_SITE_OTHER): Payer: Medicaid Other

## 2019-08-12 ENCOUNTER — Other Ambulatory Visit: Payer: Self-pay

## 2019-08-12 DIAGNOSIS — Z3A08 8 weeks gestation of pregnancy: Secondary | ICD-10-CM

## 2019-08-12 DIAGNOSIS — O3680X0 Pregnancy with inconclusive fetal viability, not applicable or unspecified: Secondary | ICD-10-CM

## 2019-08-12 NOTE — Progress Notes (Signed)
[place] 8+5 wks,single IUP w/ys,positive fht 178 bpm,normal ovaries,crl 21.58 mm

## 2019-08-19 ENCOUNTER — Telehealth: Payer: Self-pay | Admitting: *Deleted

## 2019-08-19 NOTE — Telephone Encounter (Signed)
Pt is [redacted]w[redacted]d. She called and was transferred to me with complaints of fishy smelling discharge. I discussed patient with Victorino Dike. She recommended that patient come in for self swab for trich, bv and gc/ch. Pt will have a nurse visit tomorrow morning at 9 am.

## 2019-08-20 ENCOUNTER — Encounter: Payer: Self-pay | Admitting: *Deleted

## 2019-08-20 ENCOUNTER — Other Ambulatory Visit: Payer: Self-pay

## 2019-08-20 ENCOUNTER — Other Ambulatory Visit (INDEPENDENT_AMBULATORY_CARE_PROVIDER_SITE_OTHER): Payer: Medicaid Other | Admitting: *Deleted

## 2019-08-20 ENCOUNTER — Other Ambulatory Visit: Payer: Self-pay | Admitting: Adult Health

## 2019-08-20 ENCOUNTER — Other Ambulatory Visit (HOSPITAL_COMMUNITY)
Admission: RE | Admit: 2019-08-20 | Discharge: 2019-08-20 | Disposition: A | Discharge status: Home / Self Care | Payer: Medicaid Other | Source: Ambulatory Visit | Attending: Adult Health | Admitting: Adult Health

## 2019-08-20 DIAGNOSIS — N898 Other specified noninflammatory disorders of vagina: Secondary | ICD-10-CM | POA: Insufficient documentation

## 2019-08-20 DIAGNOSIS — Z113 Encounter for screening for infections with a predominantly sexual mode of transmission: Secondary | ICD-10-CM | POA: Diagnosis not present

## 2019-08-20 NOTE — Progress Notes (Signed)
Has vaginal odor and discharge, ck for trich and BV, pt is early pregannt

## 2019-08-20 NOTE — Progress Notes (Signed)
   NURSE VISIT- VAGINITIS/STD  SUBJECTIVE:  Kristy Fowler is a 34 y.o. [id number] [redacted]w[redacted]d pregnantfemale here for a vaginal swab for vaginitis screening, STD screen.  She reports the following symptoms: discharge described as curd-like and odor for 10 days. Denies abnormal vaginal bleeding, significant pelvic pain, fever, or UTI symptoms.  OBJECTIVE:  BP 114/73 (BP Location: Left Arm, Patient Position: Sitting, Cuff Size: Large)   Pulse 85   Ht 5\' 4"  (1.626 m)   Wt 220 lb 8 oz (100 kg)   LMP 06/12/2019 (Exact Date)   BMI 37.85 kg/m   Appears well, in no apparent distress  ASSESSMENT: Vaginal swab for STD screen  PLAN: Self-collected vaginal probe for Trichomonas, Bacterial Vaginosis sent to lab Treatment: to be determined once results are received Follow-up as needed if symptoms persist/worsen, or new symptoms develop  13/01/2019  08/20/2019 9:14 AM

## 2019-08-20 NOTE — Progress Notes (Signed)
Chart reviewed for nurse visit. Agree with plan of care.  Adline Potter, NP 08/20/2019 9:17 AM

## 2019-08-21 LAB — CERVICOVAGINAL ANCILLARY ONLY
Bacterial Vaginitis (gardnerella): POSITIVE — AB
Comment: NEGATIVE
Comment: NEGATIVE
Trichomonas: NEGATIVE

## 2019-08-24 ENCOUNTER — Encounter: Payer: Self-pay | Admitting: Adult Health

## 2019-08-24 ENCOUNTER — Telehealth: Payer: Self-pay | Admitting: Adult Health

## 2019-08-24 DIAGNOSIS — B9689 Other specified bacterial agents as the cause of diseases classified elsewhere: Secondary | ICD-10-CM

## 2019-08-24 DIAGNOSIS — N760 Acute vaginitis: Secondary | ICD-10-CM

## 2019-08-24 HISTORY — DX: Other specified bacterial agents as the cause of diseases classified elsewhere: B96.89

## 2019-08-24 HISTORY — DX: Acute vaginitis: N76.0

## 2019-08-24 MED ORDER — METRONIDAZOLE 500 MG PO TABS: 500.0000 mg | ORAL_TABLET | Freq: Two times a day (BID) | ORAL | 0 refills | Status: DC

## 2019-08-24 NOTE — Telephone Encounter (Signed)
Pt aware that CV swab +BV will rx flagyl

## 2019-09-08 ENCOUNTER — Other Ambulatory Visit: Payer: Self-pay | Admitting: Obstetrics & Gynecology

## 2019-09-08 DIAGNOSIS — Z3682 Encounter for antenatal screening for nuchal translucency: Secondary | ICD-10-CM

## 2019-09-09 ENCOUNTER — Ambulatory Visit (INDEPENDENT_AMBULATORY_CARE_PROVIDER_SITE_OTHER): Payer: Medicaid Other | Admitting: Advanced Practice Midwife

## 2019-09-09 ENCOUNTER — Ambulatory Visit (INDEPENDENT_AMBULATORY_CARE_PROVIDER_SITE_OTHER): Payer: Medicaid Other

## 2019-09-09 ENCOUNTER — Ambulatory Visit: Payer: Medicaid Other | Admitting: *Deleted

## 2019-09-09 ENCOUNTER — Encounter: Payer: Self-pay | Admitting: Advanced Practice Midwife

## 2019-09-09 ENCOUNTER — Other Ambulatory Visit: Payer: Self-pay

## 2019-09-09 VITALS — BP 126/71 | HR 86 | Wt 220.0 lb

## 2019-09-09 DIAGNOSIS — Z3A12 12 weeks gestation of pregnancy: Secondary | ICD-10-CM | POA: Diagnosis not present

## 2019-09-09 DIAGNOSIS — F419 Anxiety disorder, unspecified: Secondary | ICD-10-CM | POA: Diagnosis not present

## 2019-09-09 DIAGNOSIS — Z331 Pregnant state, incidental: Secondary | ICD-10-CM | POA: Diagnosis not present

## 2019-09-09 DIAGNOSIS — Z7984 Long term (current) use of oral hypoglycemic drugs: Secondary | ICD-10-CM

## 2019-09-09 DIAGNOSIS — Z0283 Encounter for blood-alcohol and blood-drug test: Secondary | ICD-10-CM

## 2019-09-09 DIAGNOSIS — I10 Essential (primary) hypertension: Secondary | ICD-10-CM | POA: Diagnosis not present

## 2019-09-09 DIAGNOSIS — O24111 Pre-existing diabetes mellitus, type 2, in pregnancy, first trimester: Secondary | ICD-10-CM

## 2019-09-09 DIAGNOSIS — F329 Major depressive disorder, single episode, unspecified: Secondary | ICD-10-CM

## 2019-09-09 DIAGNOSIS — Z31430 Encounter of female for testing for genetic disease carrier status for procreative management: Secondary | ICD-10-CM

## 2019-09-09 DIAGNOSIS — N760 Acute vaginitis: Secondary | ICD-10-CM

## 2019-09-09 DIAGNOSIS — O99341 Other mental disorders complicating pregnancy, first trimester: Secondary | ICD-10-CM | POA: Diagnosis not present

## 2019-09-09 DIAGNOSIS — Z1389 Encounter for screening for other disorder: Secondary | ICD-10-CM | POA: Diagnosis not present

## 2019-09-09 DIAGNOSIS — O0990 Supervision of high risk pregnancy, unspecified, unspecified trimester: Secondary | ICD-10-CM

## 2019-09-09 DIAGNOSIS — Z3682 Encounter for antenatal screening for nuchal translucency: Secondary | ICD-10-CM

## 2019-09-09 DIAGNOSIS — Z113 Encounter for screening for infections with a predominantly sexual mode of transmission: Secondary | ICD-10-CM

## 2019-09-09 DIAGNOSIS — Z130 Encounter for screening for diseases of the blood and blood-forming organs and certain disorders involving the immune mechanism: Secondary | ICD-10-CM

## 2019-09-09 DIAGNOSIS — E119 Type 2 diabetes mellitus without complications: Secondary | ICD-10-CM | POA: Insufficient documentation

## 2019-09-09 DIAGNOSIS — Z8759 Personal history of other complications of pregnancy, childbirth and the puerperium: Secondary | ICD-10-CM | POA: Insufficient documentation

## 2019-09-09 LAB — POCT URINALYSIS DIPSTICK OB
Blood, UA: NEGATIVE
Glucose, UA: NEGATIVE
Ketones, UA: NEGATIVE
Leukocytes, UA: NEGATIVE
Nitrite, UA: POSITIVE
POC,PROTEIN,UA: NEGATIVE

## 2019-09-09 LAB — POCT WET PREP (WET MOUNT): Trichomonas Wet Prep HPF POC: ABSENT

## 2019-09-09 MED ORDER — METRONIDAZOLE 0.75 % VA GEL: 1.0000 | Freq: Two times a day (BID) | VAGINAL | 0 refills | Status: DC

## 2019-09-09 MED ORDER — NITROFURANTOIN MONOHYD MACRO 100 MG PO CAPS: 100.0000 mg | ORAL_CAPSULE | Freq: Two times a day (BID) | ORAL | 0 refills | Status: AC

## 2019-09-09 MED ORDER — BLOOD PRESSURE MONITOR MISC: 0 refills | Status: AC

## 2019-09-09 MED ORDER — ASPIRIN EC 81 MG PO TBEC: 162.0000 mg | DELAYED_RELEASE_TABLET | Freq: Every day | ORAL | 10 refills | Status: DC

## 2019-09-09 NOTE — Progress Notes (Signed)
[place] 12+5 wks,measurements c/w dates,crl 69.3 mm,NB present,NT 1.9 mm,fhr 155 bpm,normal ovaries,posterior placenta

## 2019-09-09 NOTE — Progress Notes (Addendum)
INITIAL OBSTETRICAL VISIT Patient name: Kristy Fowler MRN [id number]  Date of birth: [birth year] Chief Complaint:   Initial Prenatal Visit (nt/it, vaginal discharge w/ odor)  History of Present Illness:   Kristy Fowler is a 34 y.o. [phone] Hispanic female at [redacted]w[redacted]d by LMP c/w 8wk scan with an Estimated Date of Delivery: 03/18/20 being seen today for her initial obstetrical visit.   Her obstetrical history is significant for SVD x 2; gHTN with 1st preg; didn't have DM with prev pregnancies.   Today she reports vaginal irritation.  Patient's last menstrual period was 06/12/2019 (exact date). Last pap July 2020. Results were: normal Review of Systems:   Pertinent items are noted in HPI Denies cramping/contractions, leakage of fluid, vaginal bleeding, abnormal vaginal discharge w/ itching/odor/irritation, headaches, visual changes, shortness of breath, chest pain, abdominal pain, severe nausea/vomiting, or problems with urination or bowel movements unless otherwise stated above.  Pertinent History Reviewed:  Reviewed past medical,surgical, social, obstetrical and family history.  Reviewed problem list, medications and allergies. OB History  Gravida Para Term Preterm AB Living  4 2 2   1 2   SAB TAB Ectopic Multiple Live Births    1     2    # Outcome Date GA Lbr Len/2nd Weight Sex Delivery Anes PTL Lv  4 Current           3 Term 11/21/12 [redacted]w[redacted]d 05:09 / 00:08 3.77 kg F Vag-Spont None N LIV  2 TAB 2008 [redacted]w[redacted]d         1 Term 06/07/05 [redacted]w[redacted]d  3.402 kg M Vag-Spont None  LIV     Complications: Gestational hypertension   Physical Assessment:   Vitals:   09/09/19 0908  BP: 126/71  Pulse: 86  Weight: 99.8 kg  Body mass index is 37.76 kg/m.       Physical Examination:  General appearance - well appearing, and in no distress  Mental status - alert, oriented to person, place, and time  Psych:  She has a normal mood and affect  Skin - warm and dry, normal color, no suspicious lesions  noted  Chest - effort normal, all lung fields clear to auscultation bilaterally  Heart - normal rate and regular rhythm  Abdomen - soft, nontender  Extremities:  No swelling or varicosities noted  Pelvic - not done  Thin prep pap is not done   TODAY'S NT 11/07/19 12+5 wks,measurements c/w dates,crl 69.3 mm,NB present,NT 1.9 mm,fhr 155 bpm,normal ovaries,posterior placenta   Results for orders placed or performed in visit on 09/09/19 (from the past 24 hour(s))  POC Urinalysis Dipstick OB   Collection Time: 09/09/19  9:31 AM  Result Value Ref Range   Color, UA     Clarity, UA     Glucose, UA Negative Negative   Bilirubin, UA     Ketones, UA neg    Spec Grav, UA     Blood, UA neg    pH, UA     POC,PROTEIN,UA Negative Negative, Trace, Small (1+), Moderate (2+), Large (3+), 4+   Urobilinogen, UA     Nitrite, UA positive    Leukocytes, UA Negative Negative   Appearance     Odor    POCT Wet Prep 11/07/19 [place])   Collection Time: 09/09/19 10:20 AM  Result Value Ref Range   Source Wet Prep POC vag    WBC, Wet Prep HPF POC few    Bacteria Wet Prep HPF POC Few Few   BACTERIA WET  PREP MORPHOLOGY POC     Clue Cells Wet Prep HPF POC Many (A) None   Clue Cells Wet Prep Whiff POC     Yeast Wet Prep HPF POC None None   KOH Wet Prep POC     Trichomonas Wet Prep HPF POC Absent Absent    Assessment & Plan:  1) High-Risk Pregnancy [id number] at [redacted]w[redacted]d with an Estimated Date of Delivery: 03/18/20   2) Initial OB visit  3) DM2, Metformin 500mg  qd; discussed importance of strict glycemic control and adherence to low carb diet during pregnancy as well as potential complications from uncontrolled diabetes during pregnancy; advised to start checking qid sugars; can do nutritionist referral prn  4) BV, rx Metrogel bid x 4d (took Flagyl in Jan)  5) UA with +nitrites, rx Macrobid and urine to culture  6) Depression/anxiety, discussed Zoloft & counseling- pt will advise  7) Hx gHTN with first preg, rx  ASA  Meds:  Meds ordered this encounter  Medications  . Blood Pressure Monitor MISC    Sig: For regular home bp monitoring during pregnancy    Dispense:  1 each    Refill:  0  . aspirin EC 81 MG tablet    Sig: Take 2 tablets (162 mg total) by mouth daily.    Dispense:  60 tablet    Refill:  10    Order Specific Question:   Supervising Provider    Answer:   Jonnie Kind [[id number]]  . metroNIDAZOLE (METROGEL VAGINAL) 0.75 % vaginal gel    Sig: Place 1 Applicatorful vaginally 2 (two) times daily.    Dispense:  70 g    Refill:  0    Order Specific Question:   Supervising Provider    Answer:   Jonnie Kind [[id number]]  . nitrofurantoin, macrocrystal-monohydrate, (MACROBID) 100 MG capsule    Sig: Take 1 capsule (100 mg total) by mouth 2 (two) times daily for 7 days.    Dispense:  14 capsule    Refill:  0    Order Specific Question:   Supervising Provider    Answer:   Jonnie Kind [id number]    Initial labs obtained Continue prenatal vitamins Reviewed n/v relief measures and warning s/s to report Reviewed recommended weight gain based on pre-gravid BMI Encouraged well-balanced diet Genetic Screening discussed: requested Cystic fibrosis, SMA, Fragile X screening discussed requested Ultrasound discussed; fetal survey: requested CCNC completed>PCM not here, form faxed The nature of [place] for [employer] with multiple MDs and other Advanced Practice Providers was explained to patient; also emphasized that fellows, residents, and students are part of our team. Ordered home bp cuff. Check bp weekly, let us know if >140/90.   Follow-up: Return in about 6 weeks (around 10/19/2019) for [place], in person, 2nd IT, [place]: Anatomy.   Orders Placed This Encounter  Procedures  . Urine Culture  . GC/Chlamydia Probe Amp  . US OB Comp + 14 Wk  . Hemoglobin A1c  . Integrated 1  . Obstetric Panel, Including HIV  . Hemoglobinopathy evaluation  . MaterniT 21 plus Core, Blood  .  Inheritest Core(CF97,SMA,FraX)  . Pain Management Screening Profile (10S)  . POC Urinalysis Dipstick OB  . POCT Wet Prep [facility])    Myrtis Ser [facility] 09/09/2019 8:47 PM

## 2019-09-09 NOTE — Addendum Note (Signed)
Addended by: Cam Hai D on: 09/09/2019 08:47 PM   Modules accepted: Orders

## 2019-09-09 NOTE — Patient Instructions (Signed)
Kristy Fowler, I greatly value your feedback.  If you receive a survey following your visit with [place] today, we appreciate you taking the time to fill it out.  Thanks, Philipp Deputy, CNM  [facility] HOSPITAL HAS MOVED!!! It is now [facility] ([address] [place], [place] [address]) Entrance located off of [address] Free 24/7 valet parking   Nausea & Vomiting  Have saltine crackers or pretzels by your bed and eat a few bites before you raise your head out of bed in the morning  Eat small frequent meals throughout the day instead of large meals  Drink plenty of fluids throughout the day to stay hydrated, just don't drink a lot of fluids with your meals.  This can make your stomach fill up faster making you feel sick  Do not brush your teeth right after you eat  Products with real ginger are good for nausea, like ginger ale and ginger hard candy Make sure it says made with real ginger!  Sucking on sour candy like lemon heads is also good for nausea  If your prenatal vitamins make you nauseated, take them at night so you will sleep through the nausea  Sea Bands  If you feel like you need medicine for the nausea & vomiting please let us know  If you are unable to keep any fluids or food down please let us know   Constipation  Drink plenty of fluid, preferably water, throughout the day  Eat foods high in fiber such as fruits, vegetables, and grains  Exercise, such as walking, is a good way to keep your bowels regular  Drink warm fluids, especially warm prune juice, or decaf coffee  Eat a 1/2 cup of real oatmeal (not instant), 1/2 cup applesauce, and 1/2-1 cup warm prune juice every day  If needed, you may take Colace (docusate sodium) stool softener once or twice a day to help keep the stool soft.   If you still are having problems with constipation, you may take Miralax once daily as needed to help keep your bowels regular.   Home Blood  Pressure Monitoring for Patients   Your provider has recommended that you check your blood pressure (BP) at least once a week at home. If you do not have a blood pressure cuff at home, one will be provided for you. Contact your provider if you have not received your monitor within 1 week.   Helpful Tips for Accurate Home Blood Pressure Checks  . Don't smoke, exercise, or drink caffeine 30 minutes before checking your BP . Use the restroom before checking your BP (a full bladder can raise your pressure) . Relax in a comfortable upright chair . Feet on the ground . Left arm resting comfortably on a flat surface at the level of your heart . Legs uncrossed . Back supported . Sit quietly and don't talk . Place the cuff on your bare arm . Adjust snuggly, so that only two fingertips can fit between your skin and the top of the cuff . Check 2 readings separated by at least one minute . Keep a log of your BP readings . For a visual, please reference this diagram: http://ccnc.care/bpdiagram  Provider Name: [facility]     Phone: [phone]  Zone 1: ALL CLEAR  Continue to monitor your symptoms:  . BP reading is less than 140 (top number) or less than 90 (bottom number)  . No right upper stomach pain .  No headaches or seeing spots . No feeling nauseated or throwing up . No swelling in face and hands  Zone 2: CAUTION Call your doctor's office for any of the following:  . BP reading is greater than 140 (top number) or greater than 90 (bottom number)  . Stomach pain under your ribs in the middle or right side . Headaches or seeing spots . Feeling nauseated or throwing up . Swelling in face and hands  Zone 3: EMERGENCY  Seek immediate medical care if you have any of the following:  . BP reading is greater than160 (top number) or greater than 110 (bottom number) . Severe headaches not improving with Tylenol . Serious difficulty catching your breath . Any worsening symptoms from Zone  2    First Trimester of Pregnancy The first trimester of pregnancy is from week 1 until the end of week 12 (months 1 through 3). A week after a sperm fertilizes an egg, the egg will implant on the wall of the uterus. This embryo will begin to develop into a baby. Genes from you and your partner are forming the baby. The female genes determine whether the baby is a boy or a girl. At 6-8 weeks, the eyes and face are formed, and the heartbeat can be seen on ultrasound. At the end of 12 weeks, all the baby's organs are formed.  Now that you are pregnant, you will want to do everything you can to have a healthy baby. Two of the most important things are to get good prenatal care and to follow your health care provider's instructions. Prenatal care is all the medical care you receive before the baby's birth. This care will help prevent, find, and treat any problems during the pregnancy and childbirth. BODY CHANGES Your body goes through many changes during pregnancy. The changes vary from woman to woman.   You may gain or lose a couple of pounds at first.  You may feel sick to your stomach (nauseous) and throw up (vomit). If the vomiting is uncontrollable, call your health care provider.  You may tire easily.  You may develop headaches that can be relieved by medicines approved by your health care provider.  You may urinate more often. Painful urination may mean you have a bladder infection.  You may develop heartburn as a result of your pregnancy.  You may develop constipation because certain hormones are causing the muscles that push waste through your intestines to slow down.  You may develop hemorrhoids or swollen, bulging veins (varicose veins).  Your breasts may begin to grow larger and become tender. Your nipples may stick out more, and the tissue that surrounds them (areola) may become darker.  Your gums may bleed and may be sensitive to brushing and flossing.  Dark spots or blotches  (chloasma, mask of pregnancy) may develop on your face. This will likely fade after the baby is born.  Your menstrual periods will stop.  You may have a loss of appetite.  You may develop cravings for certain kinds of food.  You may have changes in your emotions from day to day, such as being excited to be pregnant or being concerned that something may go wrong with the pregnancy and baby.  You may have more vivid and strange dreams.  You may have changes in your hair. These can include thickening of your hair, rapid growth, and changes in texture. Some women also have hair loss during or after pregnancy, or hair that feels dry  or thin. Your hair will most likely return to normal after your baby is born. WHAT TO EXPECT AT YOUR PRENATAL VISITS During a routine prenatal visit:  You will be weighed to make sure you and the baby are growing normally.  Your blood pressure will be taken.  Your abdomen will be measured to track your baby's growth.  The fetal heartbeat will be listened to starting around week 10 or 12 of your pregnancy.  Test results from any previous visits will be discussed. Your health care provider may ask you:  How you are feeling.  If you are feeling the baby move.  If you have had any abnormal symptoms, such as leaking fluid, bleeding, severe headaches, or abdominal cramping.  If you have any questions. Other tests that may be performed during your first trimester include:  Blood tests to find your blood type and to check for the presence of any previous infections. They will also be used to check for low iron levels (anemia) and Rh antibodies. Later in the pregnancy, blood tests for diabetes will be done along with other tests if problems develop.  Urine tests to check for infections, diabetes, or protein in the urine.  An ultrasound to confirm the proper growth and development of the baby.  An amniocentesis to check for possible genetic problems.  Fetal  screens for spina bifida and Down syndrome.  You may need other tests to make sure you and the baby are doing well. HOME CARE INSTRUCTIONS  Medicines  Follow your health care provider's instructions regarding medicine use. Specific medicines may be either safe or unsafe to take during pregnancy.  Take your prenatal vitamins as directed.  If you develop constipation, try taking a stool softener if your health care provider approves. Diet  Eat regular, well-balanced meals. Choose a variety of foods, such as meat or vegetable-based protein, fish, milk and low-fat dairy products, vegetables, fruits, and whole grain breads and cereals. Your health care provider will help you determine the amount of weight gain that is right for you.  Avoid raw meat and uncooked cheese. These carry germs that can cause birth defects in the baby.  Eating four or five small meals rather than three large meals a day may help relieve nausea and vomiting. If you start to feel nauseous, eating a few soda crackers can be helpful. Drinking liquids between meals instead of during meals also seems to help nausea and vomiting.  If you develop constipation, eat more high-fiber foods, such as fresh vegetables or fruit and whole grains. Drink enough fluids to keep your urine clear or pale yellow. Activity and Exercise  Exercise only as directed by your health care provider. Exercising will help you:  Control your weight.  Stay in shape.  Be prepared for labor and delivery.  Experiencing pain or cramping in the lower abdomen or low back is a good sign that you should stop exercising. Check with your health care provider before continuing normal exercises.  Try to avoid standing for long periods of time. Move your legs often if you must stand in one place for a long time.  Avoid heavy lifting.  Wear low-heeled shoes, and practice good posture.  You may continue to have sex unless your health care provider directs you  otherwise. Relief of Pain or Discomfort  Wear a good support bra for breast tenderness.    Take warm sitz baths to soothe any pain or discomfort caused by hemorrhoids. Use hemorrhoid cream if your  health care provider approves.    Rest with your legs elevated if you have leg cramps or low back pain.  If you develop varicose veins in your legs, wear support hose. Elevate your feet for 15 minutes, 3-4 times a day. Limit salt in your diet. Prenatal Care  Schedule your prenatal visits by the twelfth week of pregnancy. They are usually scheduled monthly at first, then more often in the last 2 months before delivery.  Write down your questions. Take them to your prenatal visits.  Keep all your prenatal visits as directed by your health care provider. Safety  Wear your seat belt at all times when driving.  Make a list of emergency phone numbers, including numbers for family, friends, the hospital, and police and fire departments. General Tips  Ask your health care provider for a referral to a local prenatal education class. Begin classes no later than at the beginning of month 6 of your pregnancy.  Ask for help if you have counseling or nutritional needs during pregnancy. Your health care provider can offer advice or refer you to specialists for help with various needs.  Do not use hot tubs, steam rooms, or saunas.  Do not douche or use tampons or scented sanitary pads.  Do not cross your legs for long periods of time.  Avoid cat litter boxes and soil used by cats. These carry germs that can cause birth defects in the baby and possibly loss of the fetus by miscarriage or stillbirth.  Avoid all smoking, herbs, alcohol, and medicines not prescribed by your health care provider. Chemicals in these affect the formation and growth of the baby.  Schedule a dentist appointment. At home, brush your teeth with a soft toothbrush and be gentle when you floss. SEEK MEDICAL CARE IF:   You have  dizziness.  You have mild pelvic cramps, pelvic pressure, or nagging pain in the abdominal area.  You have persistent nausea, vomiting, or diarrhea.  You have a bad smelling vaginal discharge.  You have pain with urination.  You notice increased swelling in your face, hands, legs, or ankles. SEEK IMMEDIATE MEDICAL CARE IF:   You have a fever.  You are leaking fluid from your vagina.  You have spotting or bleeding from your vagina.  You have severe abdominal cramping or pain.  You have rapid weight gain or loss.  You vomit blood or material that looks like coffee grounds.  You are exposed to [place] measles and have never had them.  You are exposed to fifth disease or chickenpox.  You develop a severe headache.  You have shortness of breath.  You have any kind of trauma, such as from a fall or a car accident. Document Released: 07/17/2001 Document Revised: 12/07/2013 Document Reviewed: 06/02/2013 [facility] Patient Information 2015 [place], [place]. This information is not intended to replace advice given to you by your health care provider. Make sure you discuss any questions you have with your health care provider.  Coronavirus (COVID-19) Are you at risk?  Are you at risk for the Coronavirus (COVID-19)?  To be considered HIGH RISK for Coronavirus (COVID-19), you have to meet the following criteria:  . Traveled to [place], [place], [place], [place] or [place]; or in the [place] to [place], [place], [place], or [place]; and have fever, cough, and shortness of breath within the last 2 weeks of travel OR . Been in close contact with a person diagnosed with COVID-19 within the last 2 weeks and  have fever, cough, and shortness of breath . IF YOU DO NOT MEET THESE CRITERIA, YOU ARE CONSIDERED LOW RISK FOR COVID-19.  What to do if you are HIGH RISK for COVID-19?  Marland Kitchen If you are having a medical emergency, call 911. . Seek medical care right away. Before you go to a  doctor's office, urgent care or emergency department, call ahead and tell them about your recent travel, contact with someone diagnosed with COVID-19, and your symptoms. You should receive instructions from your physician's office regarding next steps of care.  . When you arrive at healthcare provider, tell the healthcare staff immediately you have returned from visiting [place], [place], [place], [place] or [place]; or traveled in the [place] to [place], [place], [place], or [place]; in the last two weeks or you have been in close contact with a person diagnosed with COVID-19 in the last 2 weeks.   . Tell the health care staff about your symptoms: fever, cough and shortness of breath. . After you have been seen by a medical provider, you will be either: o Tested for (COVID-19) and discharged home on quarantine except to seek medical care if symptoms worsen, and asked to  - Stay home and avoid contact with others until you get your results (4-5 days)  - Avoid travel on public transportation if possible (such as bus, train, or airplane) or o Sent to the Emergency Department by EMS for evaluation, COVID-19 testing, and possible admission depending on your condition and test results.  What to do if you are LOW RISK for COVID-19?  Reduce your risk of any infection by using the same precautions used for avoiding the common cold or flu:  Marland Kitchen Wash your hands often with soap and warm water for at least 20 seconds.  If soap and water are not readily available, use an alcohol-based hand sanitizer with at least 60% alcohol.  . If coughing or sneezing, cover your mouth and nose by coughing or sneezing into the elbow areas of your shirt or coat, into a tissue or into your sleeve (not your hands). . Avoid shaking hands with others and consider head nods or verbal greetings only. . Avoid touching your eyes, nose, or mouth with unwashed hands.  . Avoid close contact with people who are sick. . Avoid  places or events with large numbers of people in one location, like concerts or sporting events. . Carefully consider travel plans you have or are making. . If you are planning any travel outside or inside the [place], visit the CDC's Travelers' Health webpage for the latest health notices. . If you have some symptoms but not all symptoms, continue to monitor at home and seek medical attention if your symptoms worsen. . If you are having a medical emergency, call 911.   [place] / e-Visit: [email or web address]         [facility]: [facility]: [id number]                   [facility]: [phone]

## 2019-09-10 LAB — PMP SCREEN PROFILE (10S), URINE
Amphetamine Scrn, Ur: NEGATIVE ng/mL
BARBITURATE SCREEN URINE: NEGATIVE ng/mL
BENZODIAZEPINE SCREEN, URINE: NEGATIVE ng/mL
CANNABINOIDS UR QL SCN: NEGATIVE ng/mL
Cocaine (Metab) Scrn, Ur: NEGATIVE ng/mL
Creatinine(Crt), U: 127 mg/dL (ref 20.0–300.0)
Methadone Screen, Urine: NEGATIVE ng/mL
OXYCODONE+OXYMORPHONE UR QL SCN: NEGATIVE ng/mL
Opiate Scrn, Ur: NEGATIVE ng/mL
Ph of Urine: 7.9 (ref 4.5–8.9)
Phencyclidine Qn, Ur: NEGATIVE ng/mL
Propoxyphene Scrn, Ur: NEGATIVE ng/mL

## 2019-09-11 ENCOUNTER — Encounter: Payer: Self-pay | Admitting: Advanced Practice Midwife

## 2019-09-11 DIAGNOSIS — O2341 Unspecified infection of urinary tract in pregnancy, first trimester: Secondary | ICD-10-CM | POA: Insufficient documentation

## 2019-09-11 LAB — GC/CHLAMYDIA PROBE AMP
Chlamydia trachomatis, NAA: NEGATIVE
Neisseria Gonorrhoeae by PCR: NEGATIVE

## 2019-09-13 LAB — URINE CULTURE

## 2019-09-22 LAB — MATERNIT 21 PLUS CORE, BLOOD
Fetal Fraction: 8
Result (T21): NEGATIVE
Trisomy 13 (Patau syndrome): NEGATIVE
Trisomy 18 (Edwards syndrome): NEGATIVE
Trisomy 21 (Down syndrome): NEGATIVE

## 2019-09-22 LAB — INTEGRATED 1
Crown Rump Length: 69.3 mm
Gest. Age on Collection Date: 13 weeks
Maternal Age at EDD: 33.1 yr
Nuchal Translucency (NT): 1.9 mm
Number of Fetuses: 1
PAPP-A Value: 604.1 ng/mL
Weight: 220 [lb_av]

## 2019-09-22 LAB — OBSTETRIC PANEL, INCLUDING HIV
Antibody Screen: NEGATIVE
Basophils Absolute: 0 10*3/uL (ref 0.0–0.2)
Basos: 0 %
EOS (ABSOLUTE): 0.1 10*3/uL (ref 0.0–0.4)
Eos: 2 %
HIV Screen 4th Generation wRfx: NONREACTIVE
Hematocrit: 38.4 % (ref 34.0–46.6)
Hemoglobin: 13.2 g/dL (ref 11.1–15.9)
Hepatitis B Surface Ag: NEGATIVE
Immature Grans (Abs): 0 10*3/uL (ref 0.0–0.1)
Immature Granulocytes: 0 %
Lymphocytes Absolute: 2.1 10*3/uL (ref 0.7–3.1)
Lymphs: 28 %
MCH: 31.1 pg (ref 26.6–33.0)
MCHC: 34.4 g/dL (ref 31.5–35.7)
MCV: 91 fL (ref 79–97)
Monocytes Absolute: 0.4 10*3/uL (ref 0.1–0.9)
Monocytes: 6 %
Neutrophils Absolute: 4.6 10*3/uL (ref 1.4–7.0)
Neutrophils: 64 %
Platelets: 312 10*3/uL (ref 150–450)
RBC: 4.24 x10E6/uL (ref 3.77–5.28)
RDW: 12.8 % (ref 11.7–15.4)
RPR Ser Ql: NONREACTIVE
Rh Factor: POSITIVE
Rubella Antibodies, IGG: 6.33 index (ref >0.99)
WBC: 7.3 10*3/uL (ref 3.4–10.8)

## 2019-09-22 LAB — INHERITEST CORE(CF97,SMA,FRAX)

## 2019-09-22 LAB — HEMOGLOBINOPATHY EVALUATION
HGB C: 0 %
HGB S: 0 %
HGB VARIANT: 0 %
Hemoglobin A2 Quantitation: 2.3 % (ref 1.8–3.2)
Hemoglobin F Quantitation: 0 % (ref 0.0–2.0)
Hgb A: 97.7 % (ref 96.4–98.8)

## 2019-09-22 LAB — HEMOGLOBIN A1C
Est. average glucose Bld gHb Est-mCnc: 114 mg/dL
Hgb A1c MFr Bld: 5.6 % (ref 4.8–5.6)

## 2019-09-28 ENCOUNTER — Telehealth: Payer: Self-pay | Admitting: *Deleted

## 2019-09-28 NOTE — Telephone Encounter (Signed)
Patient left message that she went on a road trip this weekend to [place].  Walked a lot while there and has now started to have vaginal bleeding.   LMOVM returning patient's call.

## 2019-09-28 NOTE — Telephone Encounter (Signed)
LMOVM returning patient's call from earlier.

## 2019-09-29 ENCOUNTER — Telehealth: Payer: Self-pay | Admitting: *Deleted

## 2019-09-29 NOTE — Telephone Encounter (Signed)
Pt left message that she missed a call yesterday.

## 2019-10-05 ENCOUNTER — Telehealth: Payer: Self-pay | Admitting: *Deleted

## 2019-10-05 ENCOUNTER — Encounter: Payer: Self-pay | Admitting: *Deleted

## 2019-10-05 NOTE — Telephone Encounter (Signed)
Telephoned patient at home  Number and patient states blood pressure has been up a couple of times. Patient will experience headaches when this happens. Patient will check blood pressure when arrives home and give [place] a call back.

## 2019-10-05 NOTE — Telephone Encounter (Signed)
[facility] left message that blood pressure was elevated 140/74 and patient was experiencing headaches. Please call patient.

## 2019-10-10 ENCOUNTER — Encounter (HOSPITAL_COMMUNITY): Payer: Self-pay | Admitting: Emergency Medicine

## 2019-10-10 ENCOUNTER — Other Ambulatory Visit: Payer: Self-pay

## 2019-10-10 ENCOUNTER — Emergency Department (HOSPITAL_COMMUNITY)
Admission: EM | Admit: 2019-10-10 | Discharge: 2019-10-11 | Disposition: A | Discharge status: Home / Self Care | Payer: Medicaid Other | Attending: Emergency Medicine | Admitting: Emergency Medicine

## 2019-10-10 DIAGNOSIS — O24112 Pre-existing diabetes mellitus, type 2, in pregnancy, second trimester: Secondary | ICD-10-CM | POA: Diagnosis not present

## 2019-10-10 DIAGNOSIS — Z79899 Other long term (current) drug therapy: Secondary | ICD-10-CM | POA: Diagnosis not present

## 2019-10-10 DIAGNOSIS — Z7982 Long term (current) use of aspirin: Secondary | ICD-10-CM | POA: Diagnosis not present

## 2019-10-10 DIAGNOSIS — N3001 Acute cystitis with hematuria: Secondary | ICD-10-CM | POA: Insufficient documentation

## 2019-10-10 DIAGNOSIS — O2312 Infections of bladder in pregnancy, second trimester: Secondary | ICD-10-CM | POA: Diagnosis not present

## 2019-10-10 DIAGNOSIS — Z3A17 17 weeks gestation of pregnancy: Secondary | ICD-10-CM | POA: Diagnosis not present

## 2019-10-10 DIAGNOSIS — O4690 Antepartum hemorrhage, unspecified, unspecified trimester: Secondary | ICD-10-CM

## 2019-10-10 DIAGNOSIS — O209 Hemorrhage in early pregnancy, unspecified: Secondary | ICD-10-CM | POA: Insufficient documentation

## 2019-10-10 DIAGNOSIS — Z794 Long term (current) use of insulin: Secondary | ICD-10-CM | POA: Diagnosis not present

## 2019-10-10 DIAGNOSIS — O208 Other hemorrhage in early pregnancy: Secondary | ICD-10-CM | POA: Diagnosis not present

## 2019-10-10 NOTE — ED Triage Notes (Signed)
Pt had vaginal spotting "a couple of weeks ago" and spoke to OB-GYN but was not seen. Bleeding eventually stopped but pt started spotting again today. Pt states she has a hx of BV and not sure if this could be related. Wants to be checked out for Peace of mind since she is [redacted]weeks pregnant.

## 2019-10-11 ENCOUNTER — Ambulatory Visit (HOSPITAL_COMMUNITY)
Admission: RE | Admit: 2019-10-11 | Discharge: 2019-10-11 | Disposition: A | Discharge status: Home / Self Care | Payer: Medicaid Other | Source: Ambulatory Visit | Attending: Emergency Medicine | Admitting: Emergency Medicine

## 2019-10-11 ENCOUNTER — Other Ambulatory Visit (HOSPITAL_COMMUNITY): Payer: Self-pay | Admitting: Emergency Medicine

## 2019-10-11 DIAGNOSIS — R52 Pain, unspecified: Secondary | ICD-10-CM

## 2019-10-11 DIAGNOSIS — Z3A17 17 weeks gestation of pregnancy: Secondary | ICD-10-CM | POA: Diagnosis not present

## 2019-10-11 DIAGNOSIS — O26852 Spotting complicating pregnancy, second trimester: Secondary | ICD-10-CM | POA: Diagnosis not present

## 2019-10-11 LAB — URINALYSIS, ROUTINE W REFLEX MICROSCOPIC
Bilirubin Urine: NEGATIVE
Glucose, UA: NEGATIVE mg/dL
Ketones, ur: NEGATIVE mg/dL
Nitrite: NEGATIVE
Protein, ur: NEGATIVE mg/dL
Specific Gravity, Urine: 1.012 (ref 1.005–1.030)
pH: 6 (ref 5.0–8.0)

## 2019-10-11 LAB — CBG MONITORING, ED: Glucose-Capillary: 84 mg/dL (ref 70–99)

## 2019-10-11 LAB — WET PREP, GENITAL
Clue Cells Wet Prep HPF POC: NONE SEEN
Sperm: NONE SEEN
Trich, Wet Prep: NONE SEEN
Yeast Wet Prep HPF POC: NONE SEEN

## 2019-10-11 MED ORDER — CEPHALEXIN 500 MG PO CAPS: 500.0000 mg | ORAL_CAPSULE | Freq: Three times a day (TID) | ORAL | 0 refills | Status: DC

## 2019-10-11 MED ORDER — CEPHALEXIN 500 MG PO CAPS
500.0000 mg | ORAL_CAPSULE | Freq: Once | ORAL | Status: AC
  Administered 2019-10-11: 500 mg via ORAL
  Filled 2019-10-11: qty 1

## 2019-10-11 NOTE — ED Provider Notes (Addendum)
[facility] EMERGENCY DEPARTMENT Provider Note   CSN: [id number] Arrival date & time: 10/10/19  2153   Time seen 11:47 PM  History Chief Complaint  Patient presents with  . Vaginal Bleeding    Kristy Fowler is a 34 y.o. female.  HPI   Patient is G4, P2 AB 1, with EDC of August 13.  She is approximately [redacted] weeks pregnant.  She states she has had no problems with this pregnancy although she did call last month with vaginal spotting.  She states today off and on she is only seeing blood when she wipes.  There is no blood seen in her underwear.  She is concerned that she may have BV again, she was treated for it about a month ago and states she still has of discharge.  She denies dysuria or frequency.  She states she has a nagging discomfort in her lower groin that she has had ever since she got pregnant with this pregnancy.  She denies nausea, vomiting, or fever.  Patient is diabetic and is on Metformin.  She states her CBGs have been 85-90 5 in the morning.  PCP Maurice Small, MD] [facility]  Past Medical History:  Diagnosis Date  . Anxiety   . BV (bacterial vaginosis) 08/24/2019   +BV on CV swab, 08/24/19 rx flagyl   . Carpal tunnel syndrome of right wrist   . Depression   . Diabetes mellitus without complication (HCC)   . Fibromyalgia   . Wears glasses     Patient Active Problem List   Diagnosis Date Noted  . Infect of prt urinary tract in pregnancy, first trimester 09/11/2019  . Supervision of high risk pregnancy, antepartum 09/09/2019  . History of gestational hypertension 09/09/2019  . Diabetes mellitus without complication [facility])     Past Surgical History:  Procedure Laterality Date  . RECONSTRUCTION SURGERY'S FOR LEFT CLUB FOOT  x8  total , from child to last one 2012  . TRIGGER FINGER RELEASE Right 02/17/2015   Procedure: RIGHT CARPAL TUNNEL RELEASE;  Surgeon: Bradly Bienenstock, MD;  Location: [facility] [place];  Service: Orthopedics;  Laterality:  Right;     OB History    Gravida  4   Para  2   Term  2   Preterm      AB  1   Living  2     SAB      TAB  1   Ectopic      Multiple      Live Births  2           Family History  Problem Relation Age of Onset  . Hypertension Maternal Grandmother   . Hypertension Maternal Grandfather   . Stroke Maternal Grandfather   . Heart disease Maternal Grandfather   . Cancer Maternal Aunt        maternal great aunt  . Hypertension Mother   . Diabetes Mother     Social History   Tobacco Use  . Smoking status: Never Smoker  . Smokeless tobacco: Never Used  Substance Use Topics  . Alcohol use: No  . Drug use: No    Home Medications Prior to Admission medications   Medication Sig Start Date End Date Taking? Authorizing Provider  aspirin EC 81 MG tablet Take 2 tablets (162 mg total) by mouth daily. 09/09/19   Arabella Merles, CNM  Blood Pressure Monitor MISC For regular home bp monitoring during pregnancy 09/09/19   Cam Hai  D, CNM  cephALEXin (KEFLEX) 500 MG capsule Take 1 capsule (500 mg total) by mouth 3 (three) times daily. 10/11/19   Devoria Albe, MD  metFORMIN (GLUCOPHAGE) 500 MG tablet Take 500 mg by mouth daily with breakfast.     [provider]  metroNIDAZOLE (METROGEL VAGINAL) 0.75 % vaginal gel Place 1 Applicatorful vaginally 2 (two) times daily. 09/09/19   Arabella Merles, CNM  prenatal vitamin w/FE, FA (PRENATAL 1 + 1) 27-1 MG TABS tablet Take 1 tablet by mouth daily at 12 noon. 07/22/19   Adline Potter, NP    Allergies    Patient has no known allergies.  Review of Systems   Review of Systems  All other systems reviewed and are negative.   Physical Exam Updated Vital Signs BP 127/69 (BP Location: Right Arm)   Pulse 87   Temp 98 F (36.7 C) (Oral)   Resp 18   Ht 5\' 4"  (1.626 m)   Wt 98.9 kg   LMP 06/12/2019 (Exact Date)   SpO2 98%   BMI 37.42 kg/m   Vital signs normal    Physical Exam Vitals and nursing note reviewed.   Constitutional:      Appearance: Normal appearance.  HENT:     Head: Normocephalic and atraumatic.     Right Ear: External ear normal.     Left Ear: External ear normal.     Nose: Nose normal.     Mouth/Throat:     Mouth: Mucous membranes are moist.  Eyes:     Extraocular Movements: Extraocular movements intact.     Conjunctiva/sclera: Conjunctivae normal.     Pupils: Pupils are equal, round, and reactive to light.  Cardiovascular:     Rate and Rhythm: Normal rate and regular rhythm.  Pulmonary:     Effort: Pulmonary effort is normal. No respiratory distress.  Abdominal:     General: There is no distension.     Palpations: Abdomen is soft.     Tenderness: There is no abdominal tenderness.  Genitourinary:    Comments: Normal external genitalia, there is a small amount of white discharge seen in the vault.  Her cervix is purplish in color, when I swab around it there is a small amount of bleeding.  There is no other blood seen in the vault.  The cervix is closed. Musculoskeletal:     Cervical back: Normal range of motion and neck supple.  Skin:    General: Skin is warm and dry.  Neurological:     General: No focal deficit present.     Mental Status: She is alert and oriented to person, place, and time.     Cranial Nerves: No cranial nerve deficit.  Psychiatric:        Mood and Affect: Mood normal.        Behavior: Behavior normal.        Thought Content: Thought content normal.     ED Results / Procedures / Treatments   Labs (all labs ordered are listed, but only abnormal results are displayed) Results for orders placed or performed during the hospital encounter of 10/10/19  Wet prep, genital   Specimen: Vaginal  Result Value Ref Range   Yeast Wet Prep HPF POC NONE SEEN NONE SEEN   Trich, Wet Prep NONE SEEN NONE SEEN   Clue Cells Wet Prep HPF POC NONE SEEN NONE SEEN   WBC, Wet Prep HPF POC MODERATE (A) NONE SEEN   Sperm NONE SEEN   Urinalysis,  Routine w reflex  microscopic  Result Value Ref Range   Color, Urine YELLOW YELLOW   APPearance CLEAR CLEAR   Specific Gravity, Urine 1.012 1.005 - 1.030   pH 6.0 5.0 - 8.0   Glucose, UA NEGATIVE NEGATIVE mg/dL   Hgb urine dipstick MODERATE (A) NEGATIVE   Bilirubin Urine NEGATIVE NEGATIVE   Ketones, ur NEGATIVE NEGATIVE mg/dL   Protein, ur NEGATIVE NEGATIVE mg/dL   Nitrite NEGATIVE NEGATIVE   Leukocytes,Ua TRACE (A) NEGATIVE   RBC / HPF 0-5 0 - 5 RBC/hpf   WBC, UA 0-5 0 - 5 WBC/hpf   Bacteria, UA RARE (A) NONE SEEN   Squamous Epithelial / LPF 0-5 0 - 5   Mucus PRESENT   CBG monitoring, ED  Result Value Ref Range   Glucose-Capillary 84 70 - 99 mg/dL   Laboratory interpretation all normal except possible UTI    EKG None  Radiology No results found.  Procedures Procedures (including critical care time)  Medications Ordered in ED Medications  cephALEXin (KEFLEX) capsule 500 mg (500 mg Oral Given 10/11/19 0157)    ED Course  I have reviewed the triage vital signs and the nursing notes.  Pertinent labs & imaging results that were available during my care of the patient were reviewed by me and considered in my medical decision making (see chart for details).    MDM Rules/Calculators/A&P                      FHR 155  Review of her chart shows patient is O+  Patient was started on Keflex for possible UTI.  [facility]  She should return to the emergency department for any bleeding like a period, worsening pain, or if she passes clots.  She should do pelvic rest.  Final Clinical Impression(s) / ED Diagnoses Final diagnoses:  Vaginal bleeding in pregnancy  Acute cystitis with hematuria    Rx / DC Orders ED Discharge Orders         Ordered    cephALEXin (KEFLEX) 500 MG capsule  3 times daily     10/11/19 0143    US PELVIC COMPLETE WITH TRANSVAGINAL     10/11/19 0146         Plan discharge  Rolland Porter, MD, Barbette Or, MD 10/11/19 Irven Coe    Rolland Porter, MD 10/11/19 [phone]

## 2019-10-11 NOTE — Discharge Instructions (Addendum)
Take the antibiotics until gone.  Please follow pelvic rest until you can be rechecked at [facility].  Please call [phone] in the morning to get an ultrasound done to check your pregnancy.  Return to the emergency department if you get bleeding like a period, pass blood clots, or get worsening abdominal pain.

## 2019-10-11 NOTE — ED Provider Notes (Signed)
Pt returned for OB US.   IMPRESSION: 1. Single live intrauterine gestation in a vertex presentation with an estimated gestational age by BPD of 17 weeks and 6 days. 2. Posterior placenta with marginal previa. No retroplacental or pre placental hemorrhage seen.  Pt given a printout and told about the previa.  She has a formal US on 3/17, so that should be more detailed.    Pt stable for d/c.  Return if worse.  F/u with obgyn.     Jacalyn Lefevre, MD 10/11/19 1139

## 2019-10-12 ENCOUNTER — Other Ambulatory Visit (HOSPITAL_COMMUNITY): Payer: Self-pay | Admitting: Emergency Medicine

## 2019-10-20 ENCOUNTER — Other Ambulatory Visit: Payer: Self-pay | Admitting: Advanced Practice Midwife

## 2019-10-20 DIAGNOSIS — O0990 Supervision of high risk pregnancy, unspecified, unspecified trimester: Secondary | ICD-10-CM

## 2019-10-20 DIAGNOSIS — Z363 Encounter for antenatal screening for malformations: Secondary | ICD-10-CM

## 2019-10-21 ENCOUNTER — Ambulatory Visit (INDEPENDENT_AMBULATORY_CARE_PROVIDER_SITE_OTHER): Payer: Medicaid Other

## 2019-10-21 ENCOUNTER — Ambulatory Visit (INDEPENDENT_AMBULATORY_CARE_PROVIDER_SITE_OTHER): Payer: Medicaid Other | Admitting: Women's Health

## 2019-10-21 ENCOUNTER — Other Ambulatory Visit: Payer: Self-pay

## 2019-10-21 ENCOUNTER — Encounter: Payer: Self-pay | Admitting: Women's Health

## 2019-10-21 ENCOUNTER — Encounter: Payer: Self-pay | Admitting: *Deleted

## 2019-10-21 VITALS — BP 119/73 | HR 87 | Wt 221.2 lb

## 2019-10-21 DIAGNOSIS — O0990 Supervision of high risk pregnancy, unspecified, unspecified trimester: Secondary | ICD-10-CM

## 2019-10-21 DIAGNOSIS — E119 Type 2 diabetes mellitus without complications: Secondary | ICD-10-CM | POA: Diagnosis not present

## 2019-10-21 DIAGNOSIS — O09292 Supervision of pregnancy with other poor reproductive or obstetric history, second trimester: Secondary | ICD-10-CM

## 2019-10-21 DIAGNOSIS — Z363 Encounter for antenatal screening for malformations: Secondary | ICD-10-CM | POA: Diagnosis not present

## 2019-10-21 DIAGNOSIS — Z1379 Encounter for other screening for genetic and chromosomal anomalies: Secondary | ICD-10-CM | POA: Diagnosis not present

## 2019-10-21 DIAGNOSIS — Z87440 Personal history of urinary (tract) infections: Secondary | ICD-10-CM

## 2019-10-21 DIAGNOSIS — Z3A18 18 weeks gestation of pregnancy: Secondary | ICD-10-CM | POA: Diagnosis not present

## 2019-10-21 DIAGNOSIS — Z331 Pregnant state, incidental: Secondary | ICD-10-CM

## 2019-10-21 DIAGNOSIS — Z8759 Personal history of other complications of pregnancy, childbirth and the puerperium: Secondary | ICD-10-CM | POA: Diagnosis not present

## 2019-10-21 DIAGNOSIS — O24112 Pre-existing diabetes mellitus, type 2, in pregnancy, second trimester: Secondary | ICD-10-CM | POA: Diagnosis not present

## 2019-10-21 DIAGNOSIS — O0992 Supervision of high risk pregnancy, unspecified, second trimester: Secondary | ICD-10-CM | POA: Diagnosis not present

## 2019-10-21 DIAGNOSIS — Z1389 Encounter for screening for other disorder: Secondary | ICD-10-CM

## 2019-10-21 DIAGNOSIS — Z794 Long term (current) use of insulin: Secondary | ICD-10-CM

## 2019-10-21 DIAGNOSIS — O2341 Unspecified infection of urinary tract in pregnancy, first trimester: Secondary | ICD-10-CM

## 2019-10-21 LAB — POCT URINALYSIS DIPSTICK OB
Blood, UA: NEGATIVE
Glucose, UA: NEGATIVE
Ketones, UA: NEGATIVE
Leukocytes, UA: NEGATIVE
Nitrite, UA: NEGATIVE
POC,PROTEIN,UA: NEGATIVE

## 2019-10-21 MED ORDER — METFORMIN HCL 500 MG PO TABS: 500.0000 mg | ORAL_TABLET | Freq: Two times a day (BID) | ORAL | 6 refills | Status: DC

## 2019-10-21 NOTE — Progress Notes (Signed)
HIGH-RISK PREGNANCY VISIT Patient name: Kristy Fowler MRN [id number]  Date of birth: 07-02-1986 Chief Complaint:   Routine Prenatal Visit  History of Present Illness:   Kristy Fowler is a 34 y.o. [phone] female at [redacted]w[redacted]d with an Estimated Date of Delivery: 03/18/20 being seen today for ongoing management of a high-risk pregnancy complicated by diabetes mellitus T2DM currently on metformin 500mg  AM.  Today she reports only checking fastings 90s-100s (about 1/2 >95), doesn't check 2hr b/c is usually not home at that time- to start carrying supplies w/ her. Hasn't seen dietician in over 65yrs.  Contractions: Not present. Vag. Bleeding: Scant.  Movement: Present. denies leaking of fluid.  Review of Systems:   Pertinent items are noted in HPI Denies abnormal vaginal discharge w/ itching/odor/irritation, headaches, visual changes, shortness of breath, chest pain, abdominal pain, severe nausea/vomiting, or problems with urination or bowel movements unless otherwise stated above. Pertinent History Reviewed:  Reviewed past medical,surgical, social, obstetrical and family history.  Reviewed problem list, medications and allergies. Physical Assessment:   Vitals:   10/21/19 0921  BP: 119/73  Pulse: 87  Weight: 221 lb 3.2 oz (100.3 kg)  Body mass index is 37.97 kg/m.           Physical Examination:   General appearance: alert, well appearing, and in no distress  Mental status: alert, oriented to person, place, and time  Skin: warm & dry   Extremities: Edema: None    Cardiovascular: normal heart rate noted  Respiratory: normal respiratory effort, no distress  Abdomen: gravid, soft, non-tender  Pelvic: Cervical exam deferred         Fetal Status: Fetal Heart Rate (bpm): 148 u/s   Movement: Present    Fetal Surveillance Testing today: [place] 18+5 wks,variable position,posterior placenta gr 0,tip of cx to placenta 3.5 cm,cx 5.1 cm,svp of fluid 5.3 cm,simple left choroid plexus cyst 8 x 4 x  5.5 mm,fhr 148 bpm,EFW 343 g 99%,AC 95%,anatomy complete   Chaperone: n/a    Results for orders placed or performed in visit on 10/21/19 (from the past 24 hour(s))  POC Urinalysis Dipstick OB   Collection Time: 10/21/19  9:18 AM  Result Value Ref Range   Color, UA     Clarity, UA     Glucose, UA Negative Negative   Bilirubin, UA     Ketones, UA n    Spec Grav, UA     Blood, UA n    pH, UA     POC,PROTEIN,UA Negative Negative, Trace, Small (1+), Moderate (2+), Large (3+), 4+   Urobilinogen, UA     Nitrite, UA n    Leukocytes, UA Negative Negative   Appearance     Odor      Assessment & Plan:  1) High-risk pregnancy [id number] at [redacted]w[redacted]d with an Estimated Date of Delivery: 03/18/20   2) T2DM, FBS elevated, continue metformin 500mg  AM, add 500mg  PM, note routed to Tish to set up dietician visit and logging sugars in BS. Discussed importance of checking 2hr pp sugars, take supplies w/ her if will be away from the house. F/U in 2wks to see how sugars are doing. Fetal echo order faxed today. EFW today 99.3%  3) Resolved posterior previa  4) Fetal isolated CPC> discussed and gave printed info, NIPS neg  5) H/O GHTN> will get CMP, P:C today, continue ASA  6) UTI first trimester> urine cx poc today   Meds:  Meds ordered this encounter  Medications  . metFORMIN (  GLUCOPHAGE) 500 MG tablet    Sig: Take 1 tablet (500 mg total) by mouth 2 (two) times daily with a meal.    Dispense:  60 tablet    Refill:  6    Order Specific Question:   Supervising Provider    Answer:   Duane Lope H [[id number]]    Labs/procedures today: 2nd IT, CMP, P:C ratio  Treatment Plan:  Growth u/s @ 20, 24, 28, 32, 36wks      Fetal echo 24-28wks:_____    2x/wk testing @ 32wks or weekly BPP      Deliver @ 39wks:_____   Reviewed: Preterm labor symptoms and general obstetric precautions including but not limited to vaginal bleeding, contractions, leaking of fluid and fetal movement were reviewed in detail with the  patient.  All questions were answered. Has home bp cuff.  Check bp weekly, let us know if >140/90.   Follow-up: Return in about 2 weeks (around 11/04/2019) for HROB, MD or CNM, in person.  Orders Placed This Encounter  Procedures  . Urine Culture  . INTEGRATED 2  . Comprehensive metabolic panel  . Protein / creatinine ratio, urine  . Referral to Nutrition and Diabetes Services  . POC Urinalysis Dipstick OB   Cheral Marker CNM, [facility] 10/21/2019 10:15 AM

## 2019-10-21 NOTE — Patient Instructions (Signed)
Kristy Fowler, I greatly value your feedback.  If you receive a survey following your visit with [place] today, we appreciate you taking the time to fill it out.  Thanks, Kristy Fowler, CNM, [facility]  [facility] HAS MOVED!!! It is now [facility] at [facility] ([address] [place], [place] [address]) Entrance located off of [address] Free 24/7 valet parking   Go to [email or web address] to register for FREE online childbirth classes  [place] Pediatricians/Family Doctors:  [facility] [phone]            [facility] [phone]                 [facility] [phone] (usually not accepting new patients unless you have family there already, you are always welcome to call and ask)       [facility] [phone]       [facility] Pediatricians/Family Doctors:   [facility]: [phone]  [facility]: [phone]  [facility]: [phone]  [facility] Doctors:   [facility]: [phone]   [facility]: [phone]  [facility] Doctors:  [facility]: [phone]    Home Blood Pressure Monitoring for Patients   Your provider has recommended that you check your blood pressure (BP) at least once a week at home. If you do not have a blood pressure cuff at home, one will be provided for you. Contact your provider if you have not received your monitor within 1 week.   Helpful Tips for Accurate Home Blood Pressure Checks  . Don't smoke, exercise, or drink caffeine 30 minutes before checking your BP . Use the restroom before checking your BP (a full bladder can raise your pressure) . Relax in a comfortable upright chair . Feet on the ground . Left arm resting comfortably on a flat surface at the level of your heart . Legs uncrossed . Back supported . Sit quietly and don't talk . Place the  cuff on your bare arm . Adjust snuggly, so that only two fingertips can fit between your skin and the top of the cuff . Check 2 readings separated by at least one minute . Keep a log of your BP readings . For a visual, please reference this diagram: http://ccnc.care/bpdiagram  Provider Name: [facility]     Phone: [phone]  Zone 1: ALL CLEAR  Continue to monitor your symptoms:  . BP reading is less than 140 (top number) or less than 90 (bottom number)  . No right upper stomach pain . No headaches or seeing spots . No feeling nauseated or throwing up . No swelling in face and hands  Zone 2: CAUTION Call your doctor's office for any of the following:  . BP reading is greater than 140 (top number) or greater than 90 (bottom number)  . Stomach pain under your ribs in the middle or right side . Headaches or seeing spots . Feeling nauseated or throwing up . Swelling in face and hands  Zone 3: EMERGENCY  Seek immediate medical care if you have any of the following:  . BP reading is greater than160 (top number) or greater than 110 (bottom number) . Severe headaches not improving with Tylenol . Serious difficulty catching your breath . Any worsening symptoms from Zone 2     Second Trimester of Pregnancy The second trimester is from week 14 through week 27 (months 4 through 6). The second trimester is  often a time when you feel your best. Your body has adjusted to being pregnant, and you begin to feel better physically. Usually, morning sickness has lessened or quit completely, you may have more energy, and you may have an increase in appetite. The second trimester is also a time when the fetus is growing rapidly. At the end of the sixth month, the fetus is about 9 inches long and weighs about 1 pounds. You will likely begin to feel the baby move (quickening) between 16 and 20 weeks of pregnancy. Body changes during your second trimester Your body continues to go through many  changes during your second trimester. The changes vary from woman to woman.  Your weight will continue to increase. You will notice your lower abdomen bulging out.  You may begin to get stretch marks on your hips, abdomen, and breasts.  You may develop headaches that can be relieved by medicines. The medicines should be approved by your health care provider.  You may urinate more often because the fetus is pressing on your bladder.  You may develop or continue to have heartburn as a result of your pregnancy.  You may develop constipation because certain hormones are causing the muscles that push waste through your intestines to slow down.  You may develop hemorrhoids or swollen, bulging veins (varicose veins).  You may have back pain. This is caused by: ? Weight gain. ? Pregnancy hormones that are relaxing the joints in your pelvis. ? A shift in weight and the muscles that support your balance.  Your breasts will continue to grow and they will continue to become tender.  Your gums may bleed and may be sensitive to brushing and flossing.  Dark spots or blotches (chloasma, mask of pregnancy) may develop on your face. This will likely fade after the baby is born.  A dark line from your belly button to the pubic area (linea nigra) may appear. This will likely fade after the baby is born.  You may have changes in your hair. These can include thickening of your hair, rapid growth, and changes in texture. Some women also have hair loss during or after pregnancy, or hair that feels dry or thin. Your hair will most likely return to normal after your baby is born.  What to expect at prenatal visits During a routine prenatal visit:  You will be weighed to make sure you and the fetus are growing normally.  Your blood pressure will be taken.  Your abdomen will be measured to track your baby's growth.  The fetal heartbeat will be listened to.  Any test results from the previous visit will  be discussed.  Your health care provider may ask you:  How you are feeling.  If you are feeling the baby move.  If you have had any abnormal symptoms, such as leaking fluid, bleeding, severe headaches, or abdominal cramping.  If you are using any tobacco products, including cigarettes, chewing tobacco, and electronic cigarettes.  If you have any questions.  Other tests that may be performed during your second trimester include:  Blood tests that check for: ? Low iron levels (anemia). ? High blood sugar that affects pregnant women (gestational diabetes) between 74 and 28 weeks. ? Rh antibodies. This is to check for a protein on red blood cells (Rh factor).  Urine tests to check for infections, diabetes, or protein in the urine.  An ultrasound to confirm the proper growth and development of the baby.  An amniocentesis to  check for possible genetic problems.  Fetal screens for spina bifida and Down syndrome.  HIV (human immunodeficiency virus) testing. Routine prenatal testing includes screening for HIV, unless you choose not to have this test.  Follow these instructions at home: Medicines  Follow your health care provider's instructions regarding medicine use. Specific medicines may be either safe or unsafe to take during pregnancy.  Take a prenatal vitamin that contains at least 600 micrograms (mcg) of folic acid.  If you develop constipation, try taking a stool softener if your health care provider approves. Eating and drinking  Eat a balanced diet that includes fresh fruits and vegetables, whole grains, good sources of protein such as meat, eggs, or tofu, and low-fat dairy. Your health care provider will help you determine the amount of weight gain that is right for you.  Avoid raw meat and uncooked cheese. These carry germs that can cause birth defects in the baby.  If you have low calcium intake from food, talk to your health care provider about whether you should take  a daily calcium supplement.  Limit foods that are high in fat and processed sugars, such as fried and sweet foods.  To prevent constipation: ? Drink enough fluid to keep your urine clear or pale yellow. ? Eat foods that are high in fiber, such as fresh fruits and vegetables, whole grains, and beans. Activity  Exercise only as directed by your health care provider. Most women can continue their usual exercise routine during pregnancy. Try to exercise for 30 minutes at least 5 days a week. Stop exercising if you experience uterine contractions.  Avoid heavy lifting, wear low heel shoes, and practice good posture.  A sexual relationship may be continued unless your health care provider directs you otherwise. Relieving pain and discomfort  Wear a good support bra to prevent discomfort from breast tenderness.  Take warm sitz baths to soothe any pain or discomfort caused by hemorrhoids. Use hemorrhoid cream if your health care provider approves.  Rest with your legs elevated if you have leg cramps or low back pain.  If you develop varicose veins, wear support hose. Elevate your feet for 15 minutes, 3-4 times a day. Limit salt in your diet. Prenatal Care  Write down your questions. Take them to your prenatal visits.  Keep all your prenatal visits as told by your health care provider. This is important. Safety  Wear your seat belt at all times when driving.  Make a list of emergency phone numbers, including numbers for family, friends, the hospital, and police and fire departments. General instructions  Ask your health care provider for a referral to a local prenatal education class. Begin classes no later than the beginning of month 6 of your pregnancy.  Ask for help if you have counseling or nutritional needs during pregnancy. Your health care provider can offer advice or refer you to specialists for help with various needs.  Do not use hot tubs, steam rooms, or saunas.  Do not  douche or use tampons or scented sanitary pads.  Do not cross your legs for long periods of time.  Avoid cat litter boxes and soil used by cats. These carry germs that can cause birth defects in the baby and possibly loss of the fetus by miscarriage or stillbirth.  Avoid all smoking, herbs, alcohol, and unprescribed drugs. Chemicals in these products can affect the formation and growth of the baby.  Do not use any products that contain nicotine or tobacco, such as  cigarettes and e-cigarettes. If you need help quitting, ask your health care provider.  Visit your dentist if you have not gone yet during your pregnancy. Use a soft toothbrush to brush your teeth and be gentle when you floss. Contact a health care provider if:  You have dizziness.  You have mild pelvic cramps, pelvic pressure, or nagging pain in the abdominal area.  You have persistent nausea, vomiting, or diarrhea.  You have a bad smelling vaginal discharge.  You have pain when you urinate. Get help right away if:  You have a fever.  You are leaking fluid from your vagina.  You have spotting or bleeding from your vagina.  You have severe abdominal cramping or pain.  You have rapid weight gain or weight loss.  You have shortness of breath with chest pain.  You notice sudden or extreme swelling of your face, hands, ankles, feet, or legs.  You have not felt your baby move in over an hour.  You have severe headaches that do not go away when you take medicine.  You have vision changes. Summary  The second trimester is from week 14 through week 27 (months 4 through 6). It is also a time when the fetus is growing rapidly.  Your body goes through many changes during pregnancy. The changes vary from woman to woman.  Avoid all smoking, herbs, alcohol, and unprescribed drugs. These chemicals affect the formation and growth your baby.  Do not use any tobacco products, such as cigarettes, chewing tobacco, and  e-cigarettes. If you need help quitting, ask your health care provider.  Contact your health care provider if you have any questions. Keep all prenatal visits as told by your health care provider. This is important. This information is not intended to replace advice given to you by your health care provider. Make sure you discuss any questions you have with your health care provider. Document Released: 07/17/2001 Document Revised: 12/29/2015 Document Reviewed: 09/23/2012 Elsevier Interactive Patient Education  2017 [place] FLU! Because you are pregnant, we at [facility], along with the Centers for Disease Control (CDC), recommend that you receive the flu vaccine to protect yourself and your baby from the flu. The flu is more likely to cause severe illness in pregnant women than in women of reproductive age who are not pregnant. Changes in the immune system, heart, and lungs during pregnancy make pregnant women (and women up to two weeks postpartum) more prone to severe illness from flu, including illness resulting in hospitalization. Flu also may be harmful for a pregnant woman's developing baby. A common flu symptom is fever, which may be associated with neural tube defects and other adverse outcomes for a developing baby. Getting vaccinated can also help protect a baby after birth from flu. (Mom passes antibodies onto the developing baby during her pregnancy.)  A Flu Vaccine is the Best Protection Against Flu Getting a flu vaccine is the first and most important step in protecting against flu. Pregnant women should get a flu shot and not the live attenuated influenza vaccine (LAIV), also known as nasal spray flu vaccine. Flu vaccines given during pregnancy help protect both the mother and her baby from flu. Vaccination has been shown to reduce the risk of flu-associated acute respiratory infection in pregnant women by up to one-half. A 2018 study showed  that getting a flu shot reduced a pregnant woman's risk of being hospitalized with flu by an average of  40 percent. Pregnant women who get a flu vaccine are also helping to protect their babies from flu illness for the first several months after their birth, when they are too young to get vaccinated.   A Long Record of Safety for Flu Shots in Pregnant Women Flu shots have been given to millions of pregnant women over many years with a good safety record. There is a lot of evidence that flu vaccines can be given safely during pregnancy; though these data are limited for the first trimester. The CDC recommends that pregnant women get vaccinated during any trimester of their pregnancy. It is very important for pregnant women to get the flu shot.   Other Preventive Actions In addition to getting a flu shot, pregnant women should take the same everyday preventive actions the CDC recommends of everyone, including covering coughs, washing hands often, and avoiding people who are sick.  Symptoms and Treatment If you get sick with flu symptoms call your doctor right away. There are antiviral drugs that can treat flu illness and prevent serious flu complications. The CDC recommends prompt treatment for people who have influenza infection or suspected influenza infection and who are at high risk of serious flu complications, such as people with asthma, diabetes (including gestational diabetes), or heart disease. Early treatment of influenza in hospitalized pregnant women has been shown to reduce the length of the hospital stay.  Symptoms Flu symptoms include fever, cough, sore throat, runny or stuffy nose, body aches, headache, chills and fatigue. Some people may also have vomiting and diarrhea. People may be infected with the flu and have respiratory symptoms without a fever.  Early Treatment is Important for Pregnant Women Treatment should begin as soon as possible because antiviral drugs work best when  started early (within 48 hours after symptoms start). Antiviral drugs can make your flu illness milder and make you feel better faster. They may also prevent serious health problems that can result from flu illness. Oral oseltamivir (Tamiflu) is the preferred treatment for pregnant women because it has the most studies available to suggest that it is safe and beneficial. Antiviral drugs require a prescription from your provider. Having a fever caused by flu infection or other infections early in pregnancy may be linked to birth defects in a baby. In addition to taking antiviral drugs, pregnant women who get a fever should treat their fever with Tylenol (acetaminophen) and contact their provider immediately.  When to [place] If you are pregnant and have any of these signs, seek care immediately:  Difficulty breathing or shortness of breath  Pain or pressure in the chest or abdomen  Sudden dizziness  Confusion  Severe or persistent vomiting  High fever that is not responding to Tylenol (or store brand equivalent)  Decreased or no movement of your baby  [email or web address]

## 2019-10-21 NOTE — Progress Notes (Signed)
[place] 18+5 wks,variable position,posterior placenta gr 0,tip of cx to placenta 3.5 cm,cx 5.1 cm,svp of fluid 5.3 cm,simple left choroid plexus cyst 8 x 4 x 5.5 mm,fhr 148 bpm,EFW 343 g 99%,AC 95%,anatomy complete

## 2019-10-22 LAB — COMPREHENSIVE METABOLIC PANEL
ALT: 8 IU/L (ref 0–32)
AST: 12 IU/L (ref 0–40)
Albumin/Globulin Ratio: 1.4 (ref 1.2–2.2)
Albumin: 3.8 g/dL (ref 3.8–4.8)
Alkaline Phosphatase: 61 IU/L (ref 39–117)
BUN/Creatinine Ratio: 10 (ref 9–23)
BUN: 6 mg/dL (ref 6–20)
Bilirubin Total: 0.2 mg/dL (ref 0.0–1.2)
CO2: 19 mmol/L — ABNORMAL LOW (ref 20–29)
Calcium: 8.9 mg/dL (ref 8.7–10.2)
Chloride: 105 mmol/L (ref 96–106)
Creatinine, Ser: 0.63 mg/dL (ref 0.57–1.00)
GFR calc Af Amer: 137 mL/min/{1.73_m2} (ref >59)
GFR calc non Af Amer: 119 mL/min/{1.73_m2} (ref >59)
Globulin, Total: 2.7 g/dL (ref 1.5–4.5)
Glucose: 86 mg/dL (ref 65–99)
Potassium: 4 mmol/L (ref 3.5–5.2)
Sodium: 137 mmol/L (ref 134–144)
Total Protein: 6.5 g/dL (ref 6.0–8.5)

## 2019-10-22 LAB — PROTEIN / CREATININE RATIO, URINE
Creatinine, Urine: 138.6 mg/dL
Protein, Ur: 13.1 mg/dL
Protein/Creat Ratio: 95 mg/g creat (ref 0–200)

## 2019-10-23 LAB — INTEGRATED 2
AFP MoM: 1.17
Alpha-Fetoprotein: 40.6 ng/mL
Crown Rump Length: 69.3 mm
DIA MoM: 0.72
DIA Value: 100.2 pg/mL
Estriol, Unconjugated: 2.19 ng/mL
Gest. Age on Collection Date: 13 weeks
Gestational Age: 19 weeks
Maternal Age at EDD: 33.1 yr
Nuchal Translucency (NT): 1.9 mm
Nuchal Translucency MoM: 1.21
Number of Fetuses: 1
PAPP-A MoM: 0.87
PAPP-A Value: 604.1 ng/mL
Test Results:: NEGATIVE
Weight: 220 [lb_av]
Weight: 220 [lb_av]
hCG MoM: 1.08
hCG Value: 18.9 IU/mL
uE3 MoM: 1.36

## 2019-10-26 ENCOUNTER — Encounter: Payer: Self-pay | Admitting: *Deleted

## 2019-10-26 LAB — URINE CULTURE

## 2019-10-29 ENCOUNTER — Encounter: Payer: Medicaid Other | Attending: Advanced Practice Midwife | Admitting: Nutrition

## 2019-10-29 ENCOUNTER — Other Ambulatory Visit: Payer: Self-pay

## 2019-10-29 VITALS — Ht 64.0 in | Wt 225.0 lb

## 2019-10-29 DIAGNOSIS — Z8759 Personal history of other complications of pregnancy, childbirth and the puerperium: Secondary | ICD-10-CM | POA: Diagnosis not present

## 2019-10-29 DIAGNOSIS — O24415 Gestational diabetes mellitus in pregnancy, controlled by oral hypoglycemic drugs: Secondary | ICD-10-CM | POA: Diagnosis not present

## 2019-10-29 NOTE — Progress Notes (Signed)
  Medical Nutrition Therapy:  Appt start time: 1430 end time:  1530.   Assessment:  Primary concerns today: Gestational DM. 19 weeks 6 days. EDU Aug 13th 2021.  [id number]  Didn't have DM in either previous pregancies. Married with her spouse. She does all the cooking and shopping in the home.Eats 3 meals per day.  Works at [employer]. Metformin 500 mg BID now.  FBS: 87-95 mg/dl. Hasn't been able to test 2 hours after meals.  Preferred Learning Style:  No preference indicated   Learning Readiness:   Ready  Change in progress   MEDICATIONS:   DIETARY INTAKE:  24-hr recall:  B ( AM): 11 am Cheesesteak stub 8", Diet Dt. Pepper Snk ( AM):   L ( PM): Snk ( PM): D ( PM): Ribs, corn, fried squash, Half and half tea Snk ( PM):  Beverages: Water,diet sodas  Usual physical activity:   Estimated energy needs: 1800 calories 200 g carbohydrates 135 g protein 50 g fat  Progress Towards Goal(s):  In progress.   Nutritional Diagnosis:  NB-1.1 Food and nutrition-related knowledge deficit As related to Gestational DM.  As evidenced by Elevated glucose levels.  Intervention:  Nutrition and Gestational  Diabetes education provided on My Plate, CHO counting, meal planning, portion sizes, timing of meals, avoiding snacks between meals unless having a low blood sugar, target ranges for A1C and blood sugars, signs/symptoms and treatment of hyper/hypoglycemia, monitoring blood sugars, taking medications as prescribed, benefits of exercising 30 minutes per day and prevention of complications of DM. Mar[place] Kitchen Goals  Follow Gestational meal plan as discussed. Drink water Test blood sugars before breakfast and 2 hours after B, L, D. Don't skip meals. FBS goal less than 95 mg/dl, 2 hours less than 768 mg/dl.  Teaching Method Utilized:  Visual Auditory Hands on  Handouts given during visit include:  Gestational Packet  Meal Plan Card   Barriers to learning/adherence to lifestyle change:  none  Demonstrated degree of understanding via:  Teach Back   Monitoring/Evaluation:  Dietary intake, exercise, , and body weight in 1 week(s).

## 2019-11-03 ENCOUNTER — Encounter: Payer: Self-pay | Admitting: Nutrition

## 2019-11-03 NOTE — Patient Instructions (Addendum)
  Goals  Follow Gestational meal plan as discussed. Drink water Test blood sugars before breakfast and 2 hours after B, L, D. Don't skip meals. FBS goal less than 95 mg/dl, 2 hours less than 588 mg/dl.

## 2019-11-04 ENCOUNTER — Other Ambulatory Visit: Payer: Self-pay

## 2019-11-04 ENCOUNTER — Encounter: Payer: Self-pay | Admitting: Nutrition

## 2019-11-04 ENCOUNTER — Encounter: Payer: Medicaid Other | Attending: Advanced Practice Midwife | Admitting: Nutrition

## 2019-11-04 ENCOUNTER — Ambulatory Visit (INDEPENDENT_AMBULATORY_CARE_PROVIDER_SITE_OTHER): Payer: Medicaid Other | Admitting: Women's Health

## 2019-11-04 ENCOUNTER — Encounter: Payer: Self-pay | Admitting: Women's Health

## 2019-11-04 VITALS — BP 131/79 | HR 96 | Wt 220.4 lb

## 2019-11-04 DIAGNOSIS — E119 Type 2 diabetes mellitus without complications: Secondary | ICD-10-CM

## 2019-11-04 DIAGNOSIS — O24112 Pre-existing diabetes mellitus, type 2, in pregnancy, second trimester: Secondary | ICD-10-CM

## 2019-11-04 DIAGNOSIS — O0992 Supervision of high risk pregnancy, unspecified, second trimester: Secondary | ICD-10-CM

## 2019-11-04 DIAGNOSIS — Z3A20 20 weeks gestation of pregnancy: Secondary | ICD-10-CM

## 2019-11-04 DIAGNOSIS — Z331 Pregnant state, incidental: Secondary | ICD-10-CM

## 2019-11-04 DIAGNOSIS — Z8759 Personal history of other complications of pregnancy, childbirth and the puerperium: Secondary | ICD-10-CM | POA: Insufficient documentation

## 2019-11-04 DIAGNOSIS — O24415 Gestational diabetes mellitus in pregnancy, controlled by oral hypoglycemic drugs: Secondary | ICD-10-CM | POA: Diagnosis not present

## 2019-11-04 DIAGNOSIS — Z1389 Encounter for screening for other disorder: Secondary | ICD-10-CM

## 2019-11-04 LAB — POCT URINALYSIS DIPSTICK OB
Glucose, UA: NEGATIVE
Ketones, UA: NEGATIVE
Nitrite, UA: NEGATIVE
POC,PROTEIN,UA: NEGATIVE

## 2019-11-04 NOTE — Progress Notes (Signed)
HIGH-RISK PREGNANCY VISIT Patient name: Kristy Fowler MRN [id number]  Date of birth: September 06, 1985 Chief Complaint:   Routine Prenatal Visit  History of Present Illness:   Kristy Fowler is a 34 y.o. [phone] female at [redacted]w[redacted]d with an Estimated Date of Delivery: 03/18/20 being seen today for ongoing management of a high-risk pregnancy complicated by diabetes mellitus T2DM currently on metformin 500mg  BID.  Today she reports FBS all >95 (96-111), still not consistently checking 2hr pp, has 3 listed (109-135), states she checked a few that she didn't log. Sometimes gets hungry and eats snack before 2hrs, so wasn't sure what to do about that. Did meet w/ dietician.  Depression screen [facility] 2/9 10/29/2019 09/09/2019 02/18/2019 12/19/2016 11/13/2016  Decreased Interest 0 0 0 0 0  Down, Depressed, Hopeless 0 0 0 0 0  PHQ - 2 Score 0 0 0 0 0  Altered sleeping - 1 - - -  Tired, decreased energy - 1 - - -  Change in appetite - 1 - - -  Feeling bad or failure about yourself  - 0 - - -  Trouble concentrating - 1 - - -  Moving slowly or fidgety/restless - 1 - - -  PHQ-9 Score - 5 - - -    Contractions: Not present. Vag. Bleeding: Scant.  Movement: Present. denies leaking of fluid.  Review of Systems:   Pertinent items are noted in HPI Denies abnormal vaginal discharge w/ itching/odor/irritation, headaches, visual changes, shortness of breath, chest pain, abdominal pain, severe nausea/vomiting, or problems with urination or bowel movements unless otherwise stated above. Pertinent History Reviewed:  Reviewed past medical,surgical, social, obstetrical and family history.  Reviewed problem list, medications and allergies. Physical Assessment:   Vitals:   11/04/19 0939  BP: 131/79  Pulse: 96  Weight: 220 lb 6.4 oz (100 kg)  Body mass index is 37.83 kg/m.           Physical Examination:   General appearance: alert, well appearing, and in no distress  Mental status: alert, oriented to person,  place, and time  Skin: warm & dry   Extremities: Edema: None    Cardiovascular: normal heart rate noted  Respiratory: normal respiratory effort, no distress  Abdomen: gravid, soft, non-tender  Pelvic: Cervical exam deferred         Fetal Status: Fetal Heart Rate (bpm): 153    Movement: Present    Fetal Surveillance Testing today: doppler   Chaperone: n/a    Results for orders placed or performed in visit on 11/04/19 (from the past 24 hour(s))  POC Urinalysis Dipstick OB   Collection Time: 11/04/19  9:40 AM  Result Value Ref Range   Color, UA     Clarity, UA     Glucose, UA Negative Negative   Bilirubin, UA     Ketones, UA n    Spec Grav, UA     Blood, UA large    pH, UA     POC,PROTEIN,UA Negative Negative, Trace, Small (1+), Moderate (2+), Large (3+), 4+   Urobilinogen, UA     Nitrite, UA n    Leukocytes, UA Moderate (2+) (A) Negative   Appearance     Odor      Assessment & Plan:  1) High-risk pregnancy [id number] at [redacted]w[redacted]d with an Estimated Date of Delivery: 03/18/20   2) T2DM, unstable, FBS still elevated, insufficient 2hr pp data. Increase pm metformin to 1,000mg , continue 500mg  AM until further date on 2hr pp. If needs  to can check 1hr (goal <140) if gets hungry before 2hrs. Set alarm to help remind her. Send me mychart pic in 1wk. F/u in 2wks  3) H/O GHTN, ASA  Meds: No orders of the defined types were placed in this encounter.  Labs/procedures today: none  Treatment Plan:  Growth u/s @ 20, 24, 28, 32, 36wks      Fetal echo 24-28wks:_____    2x/wk testing @ 32wks or weekly BPP      Deliver @ 39wks:_____   Reviewed: Preterm labor symptoms and general obstetric precautions including but not limited to vaginal bleeding, contractions, leaking of fluid and fetal movement were reviewed in detail with the patient.  All questions were answered.   Follow-up: Return in about 2 weeks (around 11/18/2019) for HROB, in person, MD or CNM.  Orders Placed This Encounter  Procedures    . POC Urinalysis Dipstick OB   Cheral Marker CNM, [facility] 11/04/2019 10:30 AM

## 2019-11-04 NOTE — Patient Instructions (Signed)
Kristy Fowler, I greatly value your feedback.  If you receive a survey following your visit with [place] today, we appreciate you taking the time to fill it out.  Thanks, Joellyn Haff, CNM, WHNP-BC  [facility] ([address] [place], [place] [address]) Entrance C, located off of [address] [employer] valet parking   Constipation  Drink plenty of fluid, preferably water, throughout the day  Eat foods high in fiber such as fruits, vegetables, and grains  Exercise, such as walking, is a good way to keep your bowels regular  Drink warm fluids, especially warm prune juice, or decaf coffee  Eat a 1/2 cup of real oatmeal (not instant), 1/2 cup applesauce, and 1/2-1 cup warm prune juice every day  If needed, you may take Colace (docusate sodium) stool softener once or twice a day to help keep the stool soft. If you are pregnant, wait until you are out of your first trimester (12-14 weeks of pregnancy)  If you still are having problems with constipation, you may take Miralax once daily as needed to help keep your bowels regular.  If you are pregnant, wait until you are out of your first trimester (12-14 weeks of pregnancy)    Go to [email or web address] to register for FREE online childbirth classes  [place] Pediatricians/Family Doctors:  [facility] [phone]            [facility] [phone]                 [facility] [phone] (usually not accepting new patients unless you have family there already, you are always welcome to call and ask)       [facility] Department [phone]       [facility] Pediatricians/Family Doctors:   [facility]: [phone]  [facility]: [phone]  [facility]: [phone]  [facility] Doctors:   [facility]: [phone]   [facility]: [phone]  [facility]  Doctors:  [facility]: [phone]    Home Blood Pressure Monitoring for Patients   Your provider has recommended that you check your blood pressure (BP) at least once a week at home. If you do not have a blood pressure cuff at home, one will be provided for you. Contact your provider if you have not received your monitor within 1 week.   Helpful Tips for Accurate Home Blood Pressure Checks  . Don't smoke, exercise, or drink caffeine 30 minutes before checking your BP . Use the restroom before checking your BP (a full bladder can raise your pressure) . Relax in a comfortable upright chair . Feet on the ground . Left arm resting comfortably on a flat surface at the level of your heart . Legs uncrossed . Back supported . Sit quietly and don't talk . Place the cuff on your bare arm . Adjust snuggly, so that only two fingertips can fit between your skin and the top of the cuff . Check 2 readings separated by at least one minute . Keep a log of your BP readings . For a visual, please reference this diagram: http://ccnc.care/bpdiagram  Provider Name: [facility]     Phone: [phone]  Zone 1: ALL CLEAR  Continue to monitor your symptoms:  . BP reading is less than 140 (top number) or less than 90 (bottom number)  . No right upper stomach pain . No headaches or seeing spots . No feeling nauseated  or throwing up . No swelling in face and hands  Zone 2: CAUTION Call your doctor's office for any of the following:  . BP reading is greater than 140 (top number) or greater than 90 (bottom number)  . Stomach pain under your ribs in the middle or right side . Headaches or seeing spots . Feeling nauseated or throwing up . Swelling in face and hands  Zone 3: EMERGENCY  Seek immediate medical care if you have any of the following:  . BP reading is greater than160 (top number) or greater than 110 (bottom number) . Severe headaches not improving with Tylenol . Serious  difficulty catching your breath . Any worsening symptoms from Zone 2     Second Trimester of Pregnancy The second trimester is from week 14 through week 27 (months 4 through 6). The second trimester is often a time when you feel your best. Your body has adjusted to being pregnant, and you begin to feel better physically. Usually, morning sickness has lessened or quit completely, you may have more energy, and you may have an increase in appetite. The second trimester is also a time when the fetus is growing rapidly. At the end of the sixth month, the fetus is about 9 inches long and weighs about 1 pounds. You will likely begin to feel the baby move (quickening) between 16 and 20 weeks of pregnancy. Body changes during your second trimester Your body continues to go through many changes during your second trimester. The changes vary from woman to woman.  Your weight will continue to increase. You will notice your lower abdomen bulging out.  You may begin to get stretch marks on your hips, abdomen, and breasts.  You may develop headaches that can be relieved by medicines. The medicines should be approved by your health care provider.  You may urinate more often because the fetus is pressing on your bladder.  You may develop or continue to have heartburn as a result of your pregnancy.  You may develop constipation because certain hormones are causing the muscles that push waste through your intestines to slow down.  You may develop hemorrhoids or swollen, bulging veins (varicose veins).  You may have back pain. This is caused by: ? Weight gain. ? Pregnancy hormones that are relaxing the joints in your pelvis. ? A shift in weight and the muscles that support your balance.  Your breasts will continue to grow and they will continue to become tender.  Your gums may bleed and may be sensitive to brushing and flossing.  Dark spots or blotches (chloasma, mask of pregnancy) may develop on your  face. This will likely fade after the baby is born.  A dark line from your belly button to the pubic area (linea nigra) may appear. This will likely fade after the baby is born.  You may have changes in your hair. These can include thickening of your hair, rapid growth, and changes in texture. Some women also have hair loss during or after pregnancy, or hair that feels dry or thin. Your hair will most likely return to normal after your baby is born.  What to expect at prenatal visits During a routine prenatal visit:  You will be weighed to make sure you and the fetus are growing normally.  Your blood pressure will be taken.  Your abdomen will be measured to track your baby's growth.  The fetal heartbeat will be listened to.  Any test results from the previous visit will  be discussed.  Your health care provider may ask you:  How you are feeling.  If you are feeling the baby move.  If you have had any abnormal symptoms, such as leaking fluid, bleeding, severe headaches, or abdominal cramping.  If you are using any tobacco products, including cigarettes, chewing tobacco, and electronic cigarettes.  If you have any questions.  Other tests that may be performed during your second trimester include:  Blood tests that check for: ? Low iron levels (anemia). ? High blood sugar that affects pregnant women (gestational diabetes) between 76 and 28 weeks. ? Rh antibodies. This is to check for a protein on red blood cells (Rh factor).  Urine tests to check for infections, diabetes, or protein in the urine.  An ultrasound to confirm the proper growth and development of the baby.  An amniocentesis to check for possible genetic problems.  Fetal screens for spina bifida and Down syndrome.  HIV (human immunodeficiency virus) testing. Routine prenatal testing includes screening for HIV, unless you choose not to have this test.  Follow these instructions at home: Medicines  Follow your  health care provider's instructions regarding medicine use. Specific medicines may be either safe or unsafe to take during pregnancy.  Take a prenatal vitamin that contains at least 600 micrograms (mcg) of folic acid.  If you develop constipation, try taking a stool softener if your health care provider approves. Eating and drinking  Eat a balanced diet that includes fresh fruits and vegetables, whole grains, good sources of protein such as meat, eggs, or tofu, and low-fat dairy. Your health care provider will help you determine the amount of weight gain that is right for you.  Avoid raw meat and uncooked cheese. These carry germs that can cause birth defects in the baby.  If you have low calcium intake from food, talk to your health care provider about whether you should take a daily calcium supplement.  Limit foods that are high in fat and processed sugars, such as fried and sweet foods.  To prevent constipation: ? Drink enough fluid to keep your urine clear or pale yellow. ? Eat foods that are high in fiber, such as fresh fruits and vegetables, whole grains, and beans. Activity  Exercise only as directed by your health care provider. Most women can continue their usual exercise routine during pregnancy. Try to exercise for 30 minutes at least 5 days a week. Stop exercising if you experience uterine contractions.  Avoid heavy lifting, wear low heel shoes, and practice good posture.  A sexual relationship may be continued unless your health care provider directs you otherwise. Relieving pain and discomfort  Wear a good support bra to prevent discomfort from breast tenderness.  Take warm sitz baths to soothe any pain or discomfort caused by hemorrhoids. Use hemorrhoid cream if your health care provider approves.  Rest with your legs elevated if you have leg cramps or low back pain.  If you develop varicose veins, wear support hose. Elevate your feet for 15 minutes, 3-4 times a day.  Limit salt in your diet. Prenatal Care  Write down your questions. Take them to your prenatal visits.  Keep all your prenatal visits as told by your health care provider. This is important. Safety  Wear your seat belt at all times when driving.  Make a list of emergency phone numbers, including numbers for family, friends, the hospital, and police and fire departments. General instructions  Ask your health care provider for a referral to  a local prenatal education class. Begin classes no later than the beginning of month 6 of your pregnancy.  Ask for help if you have counseling or nutritional needs during pregnancy. Your health care provider can offer advice or refer you to specialists for help with various needs.  Do not use hot tubs, steam rooms, or saunas.  Do not douche or use tampons or scented sanitary pads.  Do not cross your legs for long periods of time.  Avoid cat litter boxes and soil used by cats. These carry germs that can cause birth defects in the baby and possibly loss of the fetus by miscarriage or stillbirth.  Avoid all smoking, herbs, alcohol, and unprescribed drugs. Chemicals in these products can affect the formation and growth of the baby.  Do not use any products that contain nicotine or tobacco, such as cigarettes and e-cigarettes. If you need help quitting, ask your health care provider.  Visit your dentist if you have not gone yet during your pregnancy. Use a soft toothbrush to brush your teeth and be gentle when you floss. Contact a health care provider if:  You have dizziness.  You have mild pelvic cramps, pelvic pressure, or nagging pain in the abdominal area.  You have persistent nausea, vomiting, or diarrhea.  You have a bad smelling vaginal discharge.  You have pain when you urinate. Get help right away if:  You have a fever.  You are leaking fluid from your vagina.  You have spotting or bleeding from your vagina.  You have severe  abdominal cramping or pain.  You have rapid weight gain or weight loss.  You have shortness of breath with chest pain.  You notice sudden or extreme swelling of your face, hands, ankles, feet, or legs.  You have not felt your baby move in over an hour.  You have severe headaches that do not go away when you take medicine.  You have vision changes. Summary  The second trimester is from week 14 through week 27 (months 4 through 6). It is also a time when the fetus is growing rapidly.  Your body goes through many changes during pregnancy. The changes vary from woman to woman.  Avoid all smoking, herbs, alcohol, and unprescribed drugs. These chemicals affect the formation and growth your baby.  Do not use any tobacco products, such as cigarettes, chewing tobacco, and e-cigarettes. If you need help quitting, ask your health care provider.  Contact your health care provider if you have any questions. Keep all prenatal visits as told by your health care provider. This is important. This information is not intended to replace advice given to you by your health care provider. Make sure you discuss any questions you have with your health care provider. Document Released: 07/17/2001 Document Revised: 12/29/2015 Document Reviewed: 09/23/2012 Elsevier Interactive Patient Education  2017 Elsevier Inc.  PROTECT YOURSELF & YOUR BABY FROM THE FLU! Because you are pregnant, we at [facility], along with the Centers for Disease Control (CDC), recommend that you receive the flu vaccine to protect yourself and your baby from the flu. The flu is more likely to cause severe illness in pregnant women than in women of reproductive age who are not pregnant. Changes in the immune system, heart, and lungs during pregnancy make pregnant women (and women up to two weeks postpartum) more prone to severe illness from flu, including illness resulting in hospitalization. Flu also may be harmful for a pregnant woman's  developing baby. A common flu  symptom is fever, which may be associated with neural tube defects and other adverse outcomes for a developing baby. Getting vaccinated can also help protect a baby after birth from flu. (Mom passes antibodies onto the developing baby during her pregnancy.)  A Flu Vaccine is the Best Protection Against Flu Getting a flu vaccine is the first and most important step in protecting against flu. Pregnant women should get a flu shot and not the live attenuated influenza vaccine (LAIV), also known as nasal spray flu vaccine. Flu vaccines given during pregnancy help protect both the mother and her baby from flu. Vaccination has been shown to reduce the risk of flu-associated acute respiratory infection in pregnant women by up to one-half. A 2018 study showed that getting a flu shot reduced a pregnant woman's risk of being hospitalized with flu by an average of 40 percent. Pregnant women who get a flu vaccine are also helping to protect their babies from flu illness for the first several months after their birth, when they are too young to get vaccinated.   A Long Record of Safety for Flu Shots in Pregnant Women Flu shots have been given to millions of pregnant women over many years with a good safety record. There is a lot of evidence that flu vaccines can be given safely during pregnancy; though these data are limited for the first trimester. The CDC recommends that pregnant women get vaccinated during any trimester of their pregnancy. It is very important for pregnant women to get the flu shot.   Other Preventive Actions In addition to getting a flu shot, pregnant women should take the same everyday preventive actions the CDC recommends of everyone, including covering coughs, washing hands often, and avoiding people who are sick.  Symptoms and Treatment If you get sick with flu symptoms call your doctor right away. There are antiviral drugs that can treat flu illness and prevent  serious flu complications. The CDC recommends prompt treatment for people who have influenza infection or suspected influenza infection and who are at high risk of serious flu complications, such as people with asthma, diabetes (including gestational diabetes), or heart disease. Early treatment of influenza in hospitalized pregnant women has been shown to reduce the length of the hospital stay.  Symptoms Flu symptoms include fever, cough, sore throat, runny or stuffy nose, body aches, headache, chills and fatigue. Some people may also have vomiting and diarrhea. People may be infected with the flu and have respiratory symptoms without a fever.  Early Treatment is Important for Pregnant Women Treatment should begin as soon as possible because antiviral drugs work best when started early (within 48 hours after symptoms start). Antiviral drugs can make your flu illness milder and make you feel better faster. They may also prevent serious health problems that can result from flu illness. Oral oseltamivir (Tamiflu) is the preferred treatment for pregnant women because it has the most studies available to suggest that it is safe and beneficial. Antiviral drugs require a prescription from your provider. Having a fever caused by flu infection or other infections early in pregnancy may be linked to birth defects in a baby. In addition to taking antiviral drugs, pregnant women who get a fever should treat their fever with Tylenol (acetaminophen) and contact their provider immediately.  When to [place] If you are pregnant and have any of these signs, seek care immediately:  Difficulty breathing or shortness of breath  Pain or pressure in the chest or abdomen  Sudden dizziness  Confusion  Severe or persistent vomiting  High fever that is not responding to Tylenol (or store brand equivalent)  Decreased or no movement of your  baby  [email or web address]

## 2019-11-04 NOTE — Progress Notes (Signed)
  Medical Nutrition Therapy:  Appt start time: 0800 and time:  0830  Assessment:  Primary concerns today: Gestational DM. 20 weeks 6 days. EDU Aug 13th 2021.  [id number]  Metformin 500 mg BID.-increased it last week from 1 a day to 2 a day.  FBS 98-111 mg/dl  2 hrs after breakfast 121, After lunch 109, 135 Hasn't tested after supper. Checking BP twice a week. 142/68 (10/26/19)  10/19/19  1347/73,  10-05-19  130/69.  Struggles with testing blood sugars after meals. Still struggles with half and half tea. BS are still slightly above recommended ranges. Meals are still inconsistent.   Preferred Learning Style:  No preference indicated   Learning Readiness:   Ready  Change in progress   MEDICATIONS:   DIETARY INTAKE:  24-hr recall:  B ( AM): Eggs and ham bites of gravy bisuit or sausage biscuit or  Snk ( AM):   L ( PM): Salad, meat, 1/2 and 1/2 tea. Snk ( PM): D ( PM): Hamburger with bun, handful of fries, water Snk ( PM):  Beverages: Water,diet sodas, 1/2 1/2 tea.  Usual physical activity:   Estimated energy needs: 1800 calories 200 g carbohydrates 135 g protein 50 g fat  Progress Towards Goal(s):  In progress.   Nutritional Diagnosis:  NB-1.1 Food and nutrition-related knowledge deficit As related to Gestational DM.  As evidenced by Elevated glucose levels.  Intervention:  Nutrition and Gestational  Diabetes education provided on My Plate, CHO counting, meal planning, portion sizes, timing of meals, avoiding snacks between meals unless having a low blood sugar, target ranges for A1C and blood sugars, signs/symptoms and treatment of hyper/hypoglycemia, monitoring blood sugars, taking medications as prescribed, benefits of exercising 30 minutes per day and prevention of complications of DM. Marland Kitchen Goals   Test more often. Eat more on a schedule.  Cut down on 1/2 and 1/2 tea. Eat 30 g CHO with meals and more protein Get FBS less than 95 and 2 hr less than 120  mg/dl. Increase higher fiber foods. .  Teaching Method Utilized:  Visual Auditory Hands on  Handouts given during visit include:  Gestational Packet  Meal Plan Card   Barriers to learning/adherence to lifestyle change: none  Demonstrated degree of understanding via:  Teach Back   Monitoring/Evaluation:  Dietary intake, exercise, , and body weight in 2  Week(s). May need to continue to increase Metformin as needed for elevated BG.

## 2019-11-04 NOTE — Patient Instructions (Signed)
  Goals   Test more often. Eat more on a schedule.  Cut down on 1/2 and 1/2 tea. Eat 30 g CHO with meals and more protein Get FBS less than 95 and 2 hr less than 120 mg/dl. Increase higher fiber foods. Marland Kitchen

## 2019-11-16 ENCOUNTER — Other Ambulatory Visit: Payer: Self-pay | Admitting: Women's Health

## 2019-11-16 ENCOUNTER — Telehealth: Payer: Self-pay | Admitting: Women's Health

## 2019-11-16 NOTE — Telephone Encounter (Signed)
Pt has a Marketing executive. I informed pt she just needs to check sugar QID not every 2 hours. I called in strips with prn refills to [facility] in [place] (i had to leave on voicemail). Pt states she didn't need lancets. JSY

## 2019-11-16 NOTE — Telephone Encounter (Signed)
Pt states that she has been having to check her blood sugars every 2 hours and the pharmacy stated that the PCP will not refill her test strips. Pt is wanting to see if our office can fill them for her.

## 2019-11-19 ENCOUNTER — Other Ambulatory Visit: Payer: Self-pay

## 2019-11-19 ENCOUNTER — Encounter: Payer: Self-pay | Admitting: Advanced Practice Midwife

## 2019-11-19 ENCOUNTER — Telehealth (INDEPENDENT_AMBULATORY_CARE_PROVIDER_SITE_OTHER): Payer: Medicaid Other | Admitting: Advanced Practice Midwife

## 2019-11-19 ENCOUNTER — Ambulatory Visit: Payer: Medicaid Other | Admitting: Nutrition

## 2019-11-19 VITALS — BP 100/66 | Wt 221.0 lb

## 2019-11-19 DIAGNOSIS — O24112 Pre-existing diabetes mellitus, type 2, in pregnancy, second trimester: Secondary | ICD-10-CM | POA: Diagnosis not present

## 2019-11-19 DIAGNOSIS — O0992 Supervision of high risk pregnancy, unspecified, second trimester: Secondary | ICD-10-CM

## 2019-11-19 DIAGNOSIS — E119 Type 2 diabetes mellitus without complications: Secondary | ICD-10-CM

## 2019-11-19 DIAGNOSIS — Z3A22 22 weeks gestation of pregnancy: Secondary | ICD-10-CM | POA: Diagnosis not present

## 2019-11-19 MED ORDER — GLYBURIDE 5 MG PO TABS: 5.0000 mg | ORAL_TABLET | Freq: Every day | ORAL | 3 refills | Status: DC

## 2019-11-19 NOTE — Telephone Encounter (Signed)
I called [facility] in [place] and spoke with the pharmacist giving a verbal for Accucheck Guide meter strips. Checks sugar QID and can have prn refills per Joellyn Haff. Pt aware. JSY

## 2019-11-19 NOTE — Progress Notes (Signed)
   TELEHEALTH VIRTUAL OBSTETRICS VISIT ENCOUNTER NOTE  I connected with Kristy Fowler on 11/19/19 at  3:10 PM EDT by telephone at home and verified that I am speaking with the correct person using two identifiers.   I discussed the limitations, risks, security and privacy concerns of performing an evaluation and management service by telephone and the availability of in person appointments. I also discussed with the patient that there may be a patient responsible charge related to this service. The patient expressed understanding and agreed to proceed.  Subjective:  Kristy Fowler is a 34 y.o. [phone] at [redacted]w[redacted]d being followed for ongoing prenatal care.  She is currently monitored for the following issues for this high-risk pregnancy and has Supervision of high risk pregnancy, antepartum; Diabetes mellitus without complication (HCC); History of gestational hypertension; and UTI (urinary tract infection) during pregnancy, first trimester on their problem list.    This is a high-risk pregnancy complicated by diabetes mellitus T2DM currently on metformin 500mg  am and 1000 mg pm Patient reports  FBS: all>100 and 2hr pp most are <120, but not checking consistently 75forgets sometimes, but also doesn't always eat ) . had fetal echo this morning, all normal.   Reports fetal movement. Denies any contractions, bleeding or leaking of fluid. Has infrequent BMs, feels constipated  Already on a stool softener.  The following portions of the patient's history were reviewed and updated as appropriate: allergies, current medications, past family history, past medical history, past social history, past surgical history and problem list.   Objective:   General:  Alert, oriented and cooperative.   Mental Status: Normal mood and affect perceived. Normal judgment and thought content.  Rest of physical exam deferred due to type of encounter  Assessment and Plan:  High risk Pregnancy: 8f at [redacted]w[redacted]d     Class B DM:  High FBS.  Add glyburide5mg  qhs,  Growth u/s @ 24, 28, 32, 36wks    24-28wks:_____    2x/wk testing @ 32wks or weekly BPP      Deliver @ 39wks:_____    .Try miralax for constipation.   Preterm labor symptoms and general obstetric precautions including but not limited to vaginal bleeding, contractions, leaking of fluid and fetal movement were reviewed in detail with the patient.  I discussed the assessment and treatment plan with the patient. The patient was provided an opportunity to ask questions and all were answered. The patient agreed with the plan and demonstrated an understanding of the instructions. The patient was advised to call back or seek an in-person office evaluation/go to [facility] at [facility] for any urgent or concerning symptoms. Please refer to After Visit Summary for other counseling recommendations.   I provided 15 minutes of non-face-to-face time during this encounter.  Return in about 2 weeks (around 12/03/2019) for HROB w/MD only, US:EFW.  No future appointments.  12/05/2019, CNM [name]

## 2019-11-19 NOTE — Patient Instructions (Signed)
Kristy Fowler, I greatly value your feedback.  If you receive a survey following your visit with [place] today, we appreciate you taking the time to fill it out.  Thanks, Cathie Beams, CNM     [facility] HAS MOVED!!! It is now [facility] at [facility] ([address] [place], [place] [address]) Entrance located off of [address] Free 24/7 valet parking   Go to [email or web address] to register for FREE online childbirth classes    Second Trimester of Pregnancy The second trimester is from week 14 through week 27 (months 4 through 6). The second trimester is often a time when you feel your best. Your body has adjusted to being pregnant, and you begin to feel better physically. Usually, morning sickness has lessened or quit completely, you may have more energy, and you may have an increase in appetite. The second trimester is also a time when the fetus is growing rapidly. At the end of the sixth month, the fetus is about 9 inches long and weighs about 1 pounds. You will likely begin to feel the baby move (quickening) between 16 and 20 weeks of pregnancy. Body changes during your second trimester Your body continues to go through many changes during your second trimester. The changes vary from woman to woman.  Your weight will continue to increase. You will notice your lower abdomen bulging out.  You may begin to get stretch marks on your hips, abdomen, and breasts.  You may develop headaches that can be relieved by medicines. The medicines should be approved by your health care provider.  You may urinate more often because the fetus is pressing on your bladder.  You may develop or continue to have heartburn as a result of your pregnancy.  You may develop constipation because certain hormones are causing the muscles that push waste through your intestines to slow down.  You may develop hemorrhoids or swollen, bulging veins (varicose veins).  You may have  back pain. This is caused by: ? Weight gain. ? Pregnancy hormones that are relaxing the joints in your pelvis. ? A shift in weight and the muscles that support your balance.  Your breasts will continue to grow and they will continue to become tender.  Your gums may bleed and may be sensitive to brushing and flossing.  Dark spots or blotches (chloasma, mask of pregnancy) may develop on your face. This will likely fade after the baby is born.  A dark line from your belly button to the pubic area (linea nigra) may appear. This will likely fade after the baby is born.  You may have changes in your hair. These can include thickening of your hair, rapid growth, and changes in texture. Some women also have hair loss during or after pregnancy, or hair that feels dry or thin. Your hair will most likely return to normal after your baby is born.  What to expect at prenatal visits During a routine prenatal visit:  You will be weighed to make sure you and the fetus are growing normally.  Your blood pressure will be taken.  Your abdomen will be measured to track your baby's growth.  The fetal heartbeat will be listened to.  Any test results from the previous visit will be discussed.  Your health care provider may ask you:  How you are feeling.  If you are feeling the baby move.  If you have had any abnormal symptoms, such as leaking fluid, bleeding, severe headaches, or  abdominal cramping.  If you are using any tobacco products, including cigarettes, chewing tobacco, and electronic cigarettes.  If you have any questions.  Other tests that may be performed during your second trimester include:  Blood tests that check for: ? Low iron levels (anemia). ? High blood sugar that affects pregnant women (gestational diabetes) between 24 and 28 weeks. ? Rh antibodies. This is to check for a protein on red blood cells (Rh factor).  Urine tests to check for infections, diabetes, or protein in  the urine.  An ultrasound to confirm the proper growth and development of the baby.  An amniocentesis to check for possible genetic problems.  Fetal screens for spina bifida and Down syndrome.  HIV (human immunodeficiency virus) testing. Routine prenatal testing includes screening for HIV, unless you choose not to have this test.  Follow these instructions at home: Medicines  Follow your health care provider's instructions regarding medicine use. Specific medicines may be either safe or unsafe to take during pregnancy.  Take a prenatal vitamin that contains at least 600 micrograms (mcg) of folic acid.  If you develop constipation, try taking a stool softener if your health care provider approves. Eating and drinking  Eat a balanced diet that includes fresh fruits and vegetables, whole grains, good sources of protein such as meat, eggs, or tofu, and low-fat dairy. Your health care provider will help you determine the amount of weight gain that is right for you.  Avoid raw meat and uncooked cheese. These carry germs that can cause birth defects in the baby.  If you have low calcium intake from food, talk to your health care provider about whether you should take a daily calcium supplement.  Limit foods that are high in fat and processed sugars, such as fried and sweet foods.  To prevent constipation: ? Drink enough fluid to keep your urine clear or pale yellow. ? Eat foods that are high in fiber, such as fresh fruits and vegetables, whole grains, and beans. Activity  Exercise only as directed by your health care provider. Most women can continue their usual exercise routine during pregnancy. Try to exercise for 30 minutes at least 5 days a week. Stop exercising if you experience uterine contractions.  Avoid heavy lifting, wear low heel shoes, and practice good posture.  A sexual relationship may be continued unless your health care provider directs you otherwise. Relieving pain  and discomfort  Wear a good support bra to prevent discomfort from breast tenderness.  Take warm sitz baths to soothe any pain or discomfort caused by hemorrhoids. Use hemorrhoid cream if your health care provider approves.  Rest with your legs elevated if you have leg cramps or low back pain.  If you develop varicose veins, wear support hose. Elevate your feet for 15 minutes, 3-4 times a day. Limit salt in your diet. Prenatal Care  Write down your questions. Take them to your prenatal visits.  Keep all your prenatal visits as told by your health care provider. This is important. Safety  Wear your seat belt at all times when driving.  Make a list of emergency phone numbers, including numbers for family, friends, the hospital, and police and fire departments. General instructions  Ask your health care provider for a referral to a local prenatal education class. Begin classes no later than the beginning of month 6 of your pregnancy.  Ask for help if you have counseling or nutritional needs during pregnancy. Your health care provider can offer advice or   refer you to specialists for help with various needs.  Do not use hot tubs, steam rooms, or saunas.  Do not douche or use tampons or scented sanitary pads.  Do not cross your legs for long periods of time.  Avoid cat litter boxes and soil used by cats. These carry germs that can cause birth defects in the baby and possibly loss of the fetus by miscarriage or stillbirth.  Avoid all smoking, herbs, alcohol, and unprescribed drugs. Chemicals in these products can affect the formation and growth of the baby.  Do not use any products that contain nicotine or tobacco, such as cigarettes and e-cigarettes. If you need help quitting, ask your health care provider.  Visit your dentist if you have not gone yet during your pregnancy. Use a soft toothbrush to brush your teeth and be gentle when you floss. Contact a health care provider  if:  You have dizziness.  You have mild pelvic cramps, pelvic pressure, or nagging pain in the abdominal area.  You have persistent nausea, vomiting, or diarrhea.  You have a bad smelling vaginal discharge.  You have pain when you urinate. Get help right away if:  You have a fever.  You are leaking fluid from your vagina.  You have spotting or bleeding from your vagina.  You have severe abdominal cramping or pain.  You have rapid weight gain or weight loss.  You have shortness of breath with chest pain.  You notice sudden or extreme swelling of your face, hands, ankles, feet, or legs.  You have not felt your baby move in over an hour.  You have severe headaches that do not go away when you take medicine.  You have vision changes. Summary  The second trimester is from week 14 through week 27 (months 4 through 6). It is also a time when the fetus is growing rapidly.  Your body goes through many changes during pregnancy. The changes vary from woman to woman.  Avoid all smoking, herbs, alcohol, and unprescribed drugs. These chemicals affect the formation and growth your baby.  Do not use any tobacco products, such as cigarettes, chewing tobacco, and e-cigarettes. If you need help quitting, ask your health care provider.  Contact your health care provider if you have any questions. Keep all prenatal visits as told by your health care provider. This is important. This information is not intended to replace advice given to you by your health care provider. Make sure you discuss any questions you have with your health care provider.

## 2019-11-19 NOTE — Telephone Encounter (Signed)
Patient called, stated that the strips she requested were never called in.  She'd like [place] to try again.  [facility].

## 2019-12-03 ENCOUNTER — Other Ambulatory Visit: Payer: Self-pay

## 2019-12-03 ENCOUNTER — Encounter: Payer: Self-pay | Admitting: Obstetrics & Gynecology

## 2019-12-03 ENCOUNTER — Ambulatory Visit (INDEPENDENT_AMBULATORY_CARE_PROVIDER_SITE_OTHER): Payer: Medicaid Other

## 2019-12-03 ENCOUNTER — Ambulatory Visit (INDEPENDENT_AMBULATORY_CARE_PROVIDER_SITE_OTHER): Payer: Medicaid Other | Admitting: Obstetrics & Gynecology

## 2019-12-03 VITALS — BP 109/70 | HR 82 | Wt 221.5 lb

## 2019-12-03 DIAGNOSIS — O24112 Pre-existing diabetes mellitus, type 2, in pregnancy, second trimester: Secondary | ICD-10-CM | POA: Diagnosis not present

## 2019-12-03 DIAGNOSIS — Z3A24 24 weeks gestation of pregnancy: Secondary | ICD-10-CM

## 2019-12-03 DIAGNOSIS — E119 Type 2 diabetes mellitus without complications: Secondary | ICD-10-CM | POA: Diagnosis not present

## 2019-12-03 DIAGNOSIS — Z1389 Encounter for screening for other disorder: Secondary | ICD-10-CM

## 2019-12-03 DIAGNOSIS — Z331 Pregnant state, incidental: Secondary | ICD-10-CM

## 2019-12-03 DIAGNOSIS — O0990 Supervision of high risk pregnancy, unspecified, unspecified trimester: Secondary | ICD-10-CM

## 2019-12-03 LAB — POCT URINALYSIS DIPSTICK OB
Glucose, UA: NEGATIVE
Ketones, UA: NEGATIVE
Nitrite, UA: NEGATIVE
POC,PROTEIN,UA: NEGATIVE

## 2019-12-03 MED ORDER — OMEPRAZOLE 20 MG PO CPDR: 20.0000 mg | DELAYED_RELEASE_CAPSULE | Freq: Every day | ORAL | 6 refills | Status: DC

## 2019-12-03 NOTE — Progress Notes (Signed)
[place] 24+6 wks,breech,posterior placenta gr 0,cx 5 cm,normal ovaries,svp of fluid 5.3 cm,fhr 161 bpm,efw 953 g 97%,AC 97%

## 2019-12-03 NOTE — Progress Notes (Signed)
HIGH-RISK PREGNANCY VISIT Patient name: Kristy Fowler MRN [id number]  Date of birth: 23-Dec-1985 Chief Complaint:   High Risk Gestation ([place] today)  History of Present Illness:   Kristy Fowler is a 34 y.o. [phone] female at [redacted]w[redacted]d with an Estimated Date of Delivery: 03/18/20 being seen today for ongoing management of a high-risk pregnancy complicated by Class B DM.  Today she reports no complaints.  Depression screen [facility] 2/9 10/29/2019 09/09/2019 02/18/2019 12/19/2016 11/13/2016  Decreased Interest 0 0 0 0 0  Down, Depressed, Hopeless 0 0 0 0 0  PHQ - 2 Score 0 0 0 0 0  Altered sleeping - 1 - - -  Tired, decreased energy - 1 - - -  Change in appetite - 1 - - -  Feeling bad or failure about yourself  - 0 - - -  Trouble concentrating - 1 - - -  Moving slowly or fidgety/restless - 1 - - -  PHQ-9 Score - 5 - - -    Contractions: Irregular. Vag. Bleeding: None.  Movement: Present. denies leaking of fluid.  Review of Systems:   Pertinent items are noted in HPI Denies abnormal vaginal discharge w/ itching/odor/irritation, headaches, visual changes, shortness of breath, chest pain, abdominal pain, severe nausea/vomiting, or problems with urination or bowel movements unless otherwise stated above. Pertinent History Reviewed:  Reviewed past medical,surgical, social, obstetrical and family history.  Reviewed problem list, medications and allergies. Physical Assessment:   Vitals:   12/03/19 1005  BP: 109/70  Pulse: 82  Weight: 221 lb 8 oz (100.5 kg)  Body mass index is 38.02 kg/m.           Physical Examination:   General appearance: alert, well appearing, and in no distress  Mental status: alert, oriented to person, place, and time  Skin: warm & dry   Extremities: Edema: None    Cardiovascular: normal heart rate noted  Respiratory: normal respiratory effort, no distress  Abdomen: gravid, soft, non-tender  Pelvic: Cervical exam deferred         Fetal Status:     Movement:  Present    Fetal Surveillance Testing today: sonogram EFW 97% tile growth   Chaperone: n/a    Results for orders placed or performed in visit on 12/03/19 (from the past 24 hour(s))  POC Urinalysis Dipstick OB   Collection Time: 12/03/19 10:01 AM  Result Value Ref Range   Color, UA     Clarity, UA     Glucose, UA Negative Negative   Bilirubin, UA     Ketones, UA neg    Spec Grav, UA     Blood, UA trace    pH, UA     POC,PROTEIN,UA Negative Negative, Trace, Small (1+), Moderate (2+), Large (3+), 4+   Urobilinogen, UA     Nitrite, UA neg    Leukocytes, UA Trace (A) Negative   Appearance     Odor      Assessment & Plan:  1) High-risk pregnancy [id number] at [redacted]w[redacted]d with an Estimated Date of Delivery: 03/18/20   2) Class B DM, stable, metformin 500 mg AM, metformin 1000 mg 2200, glyburide 5 mg 2200, EFW 97%    Meds:  Meds ordered this encounter  Medications  . omeprazole (PRILOSEC) 20 MG capsule    Sig: Take 1 capsule (20 mg total) by mouth daily. 1 tablet a day    Dispense:  30 capsule    Refill:  6    Labs/procedures today: sonogram  Treatment Plan:  PN2 without glucola at next visit  Reviewed: Preterm labor symptoms and general obstetric precautions including but not limited to vaginal bleeding, contractions, leaking of fluid and fetal movement were reviewed in detail with the patient.  All questions were answered. Has home bp cuff. Rx faxed to . Check bp weekly, let us know if >140/90.   Follow-up: Return in about 3 weeks (around 12/24/2019) for HROB, needs PN2 but not the glucola part, she is diabetic.  Orders Placed This Encounter  Procedures  . POC Urinalysis Dipstick OB   Florian Buff  12/03/2019 10:34 AM

## 2019-12-04 ENCOUNTER — Encounter: Payer: Self-pay | Admitting: *Deleted

## 2019-12-24 ENCOUNTER — Ambulatory Visit (INDEPENDENT_AMBULATORY_CARE_PROVIDER_SITE_OTHER): Payer: Medicaid Other | Admitting: Advanced Practice Midwife

## 2019-12-24 ENCOUNTER — Other Ambulatory Visit: Payer: Medicaid Other

## 2019-12-24 ENCOUNTER — Telehealth: Payer: Self-pay | Admitting: *Deleted

## 2019-12-24 VITALS — BP 123/68 | HR 89 | Wt 223.0 lb

## 2019-12-24 DIAGNOSIS — Z3A27 27 weeks gestation of pregnancy: Secondary | ICD-10-CM | POA: Diagnosis not present

## 2019-12-24 DIAGNOSIS — Z1389 Encounter for screening for other disorder: Secondary | ICD-10-CM

## 2019-12-24 DIAGNOSIS — O0990 Supervision of high risk pregnancy, unspecified, unspecified trimester: Secondary | ICD-10-CM

## 2019-12-24 DIAGNOSIS — Z331 Pregnant state, incidental: Secondary | ICD-10-CM

## 2019-12-24 DIAGNOSIS — O24419 Gestational diabetes mellitus in pregnancy, unspecified control: Secondary | ICD-10-CM

## 2019-12-24 LAB — POCT URINALYSIS DIPSTICK OB
Blood, UA: NEGATIVE
Glucose, UA: NEGATIVE
Ketones, UA: NEGATIVE
Leukocytes, UA: NEGATIVE
Nitrite, UA: NEGATIVE
POC,PROTEIN,UA: NEGATIVE

## 2019-12-24 NOTE — Progress Notes (Signed)
HIGH-RISK PREGNANCY VISIT Patient name: Kristy Fowler MRN [id number]  Date of birth: 26-May-1986 Chief Complaint:   Routine Prenatal Visit  History of Present Illness:   Kristy Fowler is a 34 y.o. [id number] female at [redacted]w[redacted]d with an Estimated Date of Delivery: 03/18/20 being seen today for ongoing management of a high-risk pregnancy complicated by Class B DM.  FBS all <95 and 4/21 >120, max 150 Today she reports no complaints. Contractions: Irregular. Vag. Bleeding: None.  Movement: Present. denies leaking of fluid.  Review of Systems:   Pertinent items are noted in HPI Denies abnormal vaginal discharge w/ itching/odor/irritation, headaches, visual changes, shortness of breath, chest pain, abdominal pain, severe nausea/vomiting, or problems with urination or bowel movements unless otherwise stated above. Pertinent History Reviewed:  Reviewed past medical,surgical, social, obstetrical and family history.  Reviewed problem list, medications and allergies. Physical Assessment:   Vitals:   12/24/19 0908  BP: 123/68  Pulse: 89  Weight: 223 lb (101.2 kg)  Body mass index is 38.28 kg/m.           Physical Examination:   General appearance: alert, well appearing, and in no distress  Mental status: alert, oriented to person, place, and time  Skin: warm & dry   Extremities: Edema: None    Cardiovascular: normal heart rate noted  Respiratory: normal respiratory effort, no distress  Abdomen: gravid, soft, non-tender  Pelvic: Cervical exam deferred         Fetal Status: Fetal Heart Rate (bpm): 141 Fundal Height: 30 cm Movement: Present    Fetal Surveillance Testing today: doppler   Results for orders placed or performed in visit on 12/24/19 (from the past 24 hour(s))  POC Urinalysis Dipstick OB   Collection Time: 12/24/19  9:06 AM  Result Value Ref Range   Color, UA     Clarity, UA     Glucose, UA Negative Negative   Bilirubin, UA     Ketones, UA n    Spec Grav, UA      Blood, UA n    pH, UA     POC,PROTEIN,UA Negative Negative, Trace, Small (1+), Moderate (2+), Large (3+), 4+   Urobilinogen, UA     Nitrite, UA n    Leukocytes, UA Negative Negative   Appearance     Odor      Assessment & Plan:  1) High-risk pregnancy [id number] at [redacted]w[redacted]d with an Estimated Date of Delivery: 03/18/20   2) Class B DM, stable Treatment plan  metformin 500 mg AM, metformin 1000 mg 2200, glyburide 5 mg 2200;  Growth [place] ASAP then q 4 weeks; weekly BPP at 32 weeks  Meds: No orders of the defined types were placed in this encounter.   Labs/procedures today: CBC, RPR, HIV, Ab screen and UA   Reviewed: Preterm labor symptoms and general obstetric precautions including but not limited to vaginal bleeding, contractions, leaking of fluid and fetal movement were reviewed in detail with the patient.  All questions were answered. has home bp cuff. Check bp weekly, let us know if >140/90.   Follow-up: Return for ASAP for growth [place] , sign BTL form; wednesdays for BPP/HROB appts starting 6/23.  Future Appointments  Date Time Provider [place]  12/29/2019  2:45 PM [facility] - [facility] [place] [facility] None  01/27/2020  9:00 AM [place] - [facility] [place] [facility] None  01/27/2020  9:50 AM Booker, Royetta Crochet, CNM [facility]    Orders Placed This Encounter  Procedures  . POC  Urinalysis Dipstick OB   [name] DNP, CNM 12/24/2019 9:58 AM

## 2019-12-24 NOTE — Patient Instructions (Signed)
Kristy Fowler, I greatly value your feedback.  If you receive a survey following your visit with [place] today, we appreciate you taking the time to fill it out.  Thanks, Cathie Beams, CNM   [facility] HAS MOVED!!! It is now [facility] ([address] [place], [place] [address]) Entrance located off of [address] Free 24/7 valet parking   Go to [email or web address] to register for FREE online childbirth classes    Call the office [phone]) or go to [facility] if:  You begin to have strong, frequent contractions  Your water breaks.  Sometimes it is a big gush of fluid, sometimes it is just a trickle that keeps getting your panties wet or running down your legs  You have vaginal bleeding.  It is normal to have a small amount of spotting if your cervix was checked.   You don't feel your baby moving like normal.  If you don't, get you something to eat and drink and lay down and focus on feeling your baby move.  You should feel at least 10 movements in 2 hours.  If you don't, you should call the office or go to [facility].    Tdap Vaccine  It is recommended that you get the Tdap vaccine during the third trimester of EACH pregnancy to help protect your baby from getting pertussis (whooping cough)  27-36 weeks is the BEST time to do this so that you can pass the protection on to your baby. During pregnancy is better than after pregnancy, but if you are unable to get it during pregnancy it will be offered at the hospital.   You will be offered this vaccine in the office after 27 weeks. If you do not have health insurance, you can get this vaccine at the health department or your family doctor  Everyone who will be around your baby should also be up-to-date on their vaccines. Adults (who are not pregnant) only need 1 dose of Tdap during adulthood.   Third Trimester of Pregnancy The third trimester is from week 29 through week 42, months 7 through  9. The third trimester is a time when the fetus is growing rapidly. At the end of the ninth month, the fetus is about 20 inches in length and weighs 6-10 pounds.  BODY CHANGES Your body goes through many changes during pregnancy. The changes vary from woman to woman.   Your weight will continue to increase. You can expect to gain 25-35 pounds (11-16 kg) by the end of the pregnancy.  You may begin to get stretch marks on your hips, abdomen, and breasts.  You may urinate more often because the fetus is moving lower into your pelvis and pressing on your bladder.  You may develop or continue to have heartburn as a result of your pregnancy.  You may develop constipation because certain hormones are causing the muscles that push waste through your intestines to slow down.  You may develop hemorrhoids or swollen, bulging veins (varicose veins).  You may have pelvic pain because of the weight gain and pregnancy hormones relaxing your joints between the bones in your pelvis. Backaches may result from overexertion of the muscles supporting your posture.  You may have changes in your hair. These can include thickening of your hair, rapid growth, and changes in texture. Some women also have hair loss during or after pregnancy, or hair that feels dry or thin. Your hair will most likely return to  normal after your baby is born.  Your breasts will continue to grow and be tender. A yellow discharge may leak from your breasts called colostrum.  Your belly button may stick out.  You may feel short of breath because of your expanding uterus.  You may notice the fetus "dropping," or moving lower in your abdomen.  You may have a bloody mucus discharge. This usually occurs a few days to a week before labor begins.  Your cervix becomes thin and soft (effaced) near your due date. WHAT TO EXPECT AT YOUR PRENATAL EXAMS  You will have prenatal exams every 2 weeks until week 36. Then, you will have weekly  prenatal exams. During a routine prenatal visit:  You will be weighed to make sure you and the fetus are growing normally.  Your blood pressure is taken.  Your abdomen will be measured to track your baby's growth.  The fetal heartbeat will be listened to.  Any test results from the previous visit will be discussed.  You may have a cervical check near your due date to see if you have effaced. At around 36 weeks, your caregiver will check your cervix. At the same time, your caregiver will also perform a test on the secretions of the vaginal tissue. This test is to determine if a type of bacteria, Group B streptococcus, is present. Your caregiver will explain this further. Your caregiver may ask you:  What your birth plan is.  How you are feeling.  If you are feeling the baby move.  If you have had any abnormal symptoms, such as leaking fluid, bleeding, severe headaches, or abdominal cramping.  If you have any questions. Other tests or screenings that may be performed during your third trimester include:  Blood tests that check for low iron levels (anemia).  Fetal testing to check the health, activity level, and growth of the fetus. Testing is done if you have certain medical conditions or if there are problems during the pregnancy. FALSE LABOR You may feel small, irregular contractions that eventually go away. These are called Braxton Hicks contractions, or false labor. Contractions may last for hours, days, or even weeks before true labor sets in. If contractions come at regular intervals, intensify, or become painful, it is best to be seen by your caregiver.  SIGNS OF LABOR   Menstrual-like cramps.  Contractions that are 5 minutes apart or less.  Contractions that start on the top of the uterus and spread down to the lower abdomen and back.  A sense of increased pelvic pressure or back pain.  A watery or bloody mucus discharge that comes from the vagina. If you have any of  these signs before the 37th week of pregnancy, call your caregiver right away. You need to go to the hospital to get checked immediately. HOME CARE INSTRUCTIONS   Avoid all smoking, herbs, alcohol, and unprescribed drugs. These chemicals affect the formation and growth of the baby.  Follow your caregiver's instructions regarding medicine use. There are medicines that are either safe or unsafe to take during pregnancy.  Exercise only as directed by your caregiver. Experiencing uterine cramps is a good sign to stop exercising.  Continue to eat regular, healthy meals.  Wear a good support bra for breast tenderness.  Do not use hot tubs, steam rooms, or saunas.  Wear your seat belt at all times when driving.  Avoid raw meat, uncooked cheese, cat litter boxes, and soil used by cats. These carry germs that can  cause birth defects in the baby.  Take your prenatal vitamins.  Try taking a stool softener (if your caregiver approves) if you develop constipation. Eat more high-fiber foods, such as fresh vegetables or fruit and whole grains. Drink plenty of fluids to keep your urine clear or pale yellow.  Take warm sitz baths to soothe any pain or discomfort caused by hemorrhoids. Use hemorrhoid cream if your caregiver approves.  If you develop varicose veins, wear support hose. Elevate your feet for 15 minutes, 3-4 times a day. Limit salt in your diet.  Avoid heavy lifting, wear low heal shoes, and practice good posture.  Rest a lot with your legs elevated if you have leg cramps or low back pain.  Visit your dentist if you have not gone during your pregnancy. Use a soft toothbrush to brush your teeth and be gentle when you floss.  A sexual relationship may be continued unless your caregiver directs you otherwise.  Do not travel far distances unless it is absolutely necessary and only with the approval of your caregiver.  Take prenatal classes to understand, practice, and ask questions about  the labor and delivery.  Make a trial run to the hospital.  Pack your hospital bag.  Prepare the baby's nursery.  Continue to go to all your prenatal visits as directed by your caregiver. SEEK MEDICAL CARE IF:  You are unsure if you are in labor or if your water has broken.  You have dizziness.  You have mild pelvic cramps, pelvic pressure, or nagging pain in your abdominal area.  You have persistent nausea, vomiting, or diarrhea.  You have a bad smelling vaginal discharge.  You have pain with urination. SEEK IMMEDIATE MEDICAL CARE IF:   You have a fever.  You are leaking fluid from your vagina.  You have spotting or bleeding from your vagina.  You have severe abdominal cramping or pain.  You have rapid weight loss or gain.  You have shortness of breath with chest pain.  You notice sudden or extreme swelling of your face, hands, ankles, feet, or legs.  You have not felt your baby move in over an hour.  You have severe headaches that do not go away with medicine.  You have vision changes. Document Released: 07/17/2001 Document Revised: 07/28/2013 Document Reviewed: 09/23/2012 [facility] Patient Information 2015 [place], [place]. This information is not intended to replace advice given to you by your health care provider. Make sure you discuss any questions you have with your health care provider.

## 2019-12-24 NOTE — Telephone Encounter (Signed)
Referral faxed to [facility] as requested.  

## 2019-12-25 LAB — RPR: RPR Ser Ql: NONREACTIVE

## 2019-12-25 LAB — CBC
Hematocrit: 32.1 % — ABNORMAL LOW (ref 34.0–46.6)
Hemoglobin: 10.9 g/dL — ABNORMAL LOW (ref 11.1–15.9)
MCH: 29.5 pg (ref 26.6–33.0)
MCHC: 34 g/dL (ref 31.5–35.7)
MCV: 87 fL (ref 79–97)
Platelets: 271 10*3/uL (ref 150–450)
RBC: 3.7 x10E6/uL — ABNORMAL LOW (ref 3.77–5.28)
RDW: 11.6 % — ABNORMAL LOW (ref 11.7–15.4)
WBC: 7.3 10*3/uL (ref 3.4–10.8)

## 2019-12-25 LAB — ANTIBODY SCREEN: Antibody Screen: NEGATIVE

## 2019-12-25 LAB — HIV ANTIBODY (ROUTINE TESTING W REFLEX): HIV Screen 4th Generation wRfx: NONREACTIVE

## 2019-12-28 ENCOUNTER — Other Ambulatory Visit: Payer: Self-pay | Admitting: Advanced Practice Midwife

## 2019-12-28 ENCOUNTER — Telehealth: Payer: Self-pay | Admitting: *Deleted

## 2019-12-28 DIAGNOSIS — O24012 Pre-existing diabetes mellitus, type 1, in pregnancy, second trimester: Secondary | ICD-10-CM

## 2019-12-28 DIAGNOSIS — F418 Other specified anxiety disorders: Secondary | ICD-10-CM | POA: Insufficient documentation

## 2019-12-28 DIAGNOSIS — O10919 Unspecified pre-existing hypertension complicating pregnancy, unspecified trimester: Secondary | ICD-10-CM

## 2019-12-28 MED ORDER — SERTRALINE HCL 25 MG PO TABS: 25.0000 mg | ORAL_TABLET | Freq: Every day | ORAL | 6 refills | Status: DC

## 2019-12-28 NOTE — Telephone Encounter (Signed)
Was to call in something for depression and anxiety to pharmacy at visit on Thurs/ uses [facility] but no meds have been called in can check this out for  her

## 2019-12-28 NOTE — Addendum Note (Signed)
Addended by: Cheral Marker on: 12/28/2019 12:36 PM   Modules accepted: Orders

## 2019-12-28 NOTE — Telephone Encounter (Signed)
Patient states she is wanting to start Zoloft.  Referral faxed in to [facility] for counseling.

## 2019-12-29 ENCOUNTER — Ambulatory Visit (INDEPENDENT_AMBULATORY_CARE_PROVIDER_SITE_OTHER): Payer: Medicaid Other

## 2019-12-29 DIAGNOSIS — O10913 Unspecified pre-existing hypertension complicating pregnancy, third trimester: Secondary | ICD-10-CM | POA: Diagnosis not present

## 2019-12-29 DIAGNOSIS — O24013 Pre-existing diabetes mellitus, type 1, in pregnancy, third trimester: Secondary | ICD-10-CM | POA: Diagnosis not present

## 2019-12-29 DIAGNOSIS — O2343 Unspecified infection of urinary tract in pregnancy, third trimester: Secondary | ICD-10-CM | POA: Diagnosis not present

## 2019-12-29 DIAGNOSIS — O10919 Unspecified pre-existing hypertension complicating pregnancy, unspecified trimester: Secondary | ICD-10-CM

## 2019-12-29 DIAGNOSIS — O2341 Unspecified infection of urinary tract in pregnancy, first trimester: Secondary | ICD-10-CM

## 2019-12-29 DIAGNOSIS — O0993 Supervision of high risk pregnancy, unspecified, third trimester: Secondary | ICD-10-CM

## 2019-12-29 DIAGNOSIS — Z3A28 28 weeks gestation of pregnancy: Secondary | ICD-10-CM

## 2019-12-29 DIAGNOSIS — Z8759 Personal history of other complications of pregnancy, childbirth and the puerperium: Secondary | ICD-10-CM | POA: Diagnosis not present

## 2019-12-29 DIAGNOSIS — O0990 Supervision of high risk pregnancy, unspecified, unspecified trimester: Secondary | ICD-10-CM

## 2019-12-29 DIAGNOSIS — O24012 Pre-existing diabetes mellitus, type 1, in pregnancy, second trimester: Secondary | ICD-10-CM

## 2019-12-29 NOTE — Progress Notes (Signed)
[place] 28+4 wks,breech,posterior placenta gr 0,cx 4.7 cm,afi 16.8 cm,fhr 137 bpm,EFW 1739 g 99%

## 2020-01-07 DIAGNOSIS — F329 Major depressive disorder, single episode, unspecified: Secondary | ICD-10-CM | POA: Diagnosis not present

## 2020-01-26 ENCOUNTER — Other Ambulatory Visit: Payer: Self-pay | Admitting: Advanced Practice Midwife

## 2020-01-26 DIAGNOSIS — O24419 Gestational diabetes mellitus in pregnancy, unspecified control: Secondary | ICD-10-CM

## 2020-01-27 ENCOUNTER — Ambulatory Visit (INDEPENDENT_AMBULATORY_CARE_PROVIDER_SITE_OTHER): Payer: Medicaid Other | Admitting: Women's Health

## 2020-01-27 ENCOUNTER — Encounter: Payer: Self-pay | Admitting: Women's Health

## 2020-01-27 ENCOUNTER — Other Ambulatory Visit: Payer: Self-pay

## 2020-01-27 ENCOUNTER — Ambulatory Visit (INDEPENDENT_AMBULATORY_CARE_PROVIDER_SITE_OTHER): Payer: Medicaid Other

## 2020-01-27 VITALS — BP 116/77 | HR 85 | Wt 226.4 lb

## 2020-01-27 DIAGNOSIS — O24419 Gestational diabetes mellitus in pregnancy, unspecified control: Secondary | ICD-10-CM

## 2020-01-27 DIAGNOSIS — Z3A32 32 weeks gestation of pregnancy: Secondary | ICD-10-CM

## 2020-01-27 DIAGNOSIS — Z1389 Encounter for screening for other disorder: Secondary | ICD-10-CM

## 2020-01-27 DIAGNOSIS — Z23 Encounter for immunization: Secondary | ICD-10-CM | POA: Diagnosis not present

## 2020-01-27 DIAGNOSIS — O0990 Supervision of high risk pregnancy, unspecified, unspecified trimester: Secondary | ICD-10-CM

## 2020-01-27 DIAGNOSIS — O0993 Supervision of high risk pregnancy, unspecified, third trimester: Secondary | ICD-10-CM

## 2020-01-27 DIAGNOSIS — E119 Type 2 diabetes mellitus without complications: Secondary | ICD-10-CM

## 2020-01-27 DIAGNOSIS — O09293 Supervision of pregnancy with other poor reproductive or obstetric history, third trimester: Secondary | ICD-10-CM

## 2020-01-27 DIAGNOSIS — O24913 Unspecified diabetes mellitus in pregnancy, third trimester: Secondary | ICD-10-CM

## 2020-01-27 DIAGNOSIS — Z331 Pregnant state, incidental: Secondary | ICD-10-CM

## 2020-01-27 LAB — POCT URINALYSIS DIPSTICK OB
Blood, UA: NEGATIVE
Glucose, UA: NEGATIVE
Ketones, UA: NEGATIVE
Nitrite, UA: NEGATIVE
POC,PROTEIN,UA: NEGATIVE

## 2020-01-27 NOTE — Progress Notes (Signed)
[place] 32+5 wks,cephalic,BPP 8/8,FHR 138 bpm,posterior placenta gr 3,cx 3.7 cm,afi 13.2 cm,EFW 2782 g 99%,AC 99%

## 2020-01-27 NOTE — Progress Notes (Signed)
HIGH-RISK PREGNANCY VISIT Patient name: Kristy Fowler MRN [id number]  Date of birth: May 13, 1986 Chief Complaint:   Routine Prenatal Visit (Ultrasound)  History of Present Illness:   Kristy Fowler is a 34 y.o. [id number] female at [redacted]w[redacted]d with an Estimated Date of Delivery: 03/18/20 being seen today for ongoing management of a high-risk pregnancy complicated by diabetes mellitus T2DM currently on metformin 500mg  AM/1,000mg  qhs, glyburide 5mg  qhs.  Today she reports all FBS <95, 2hr pp 84-139 (almost 1/2 >120).  Depression screen [facility] 2/9 12/24/2019 10/29/2019 09/09/2019 02/18/2019 12/19/2016  Decreased Interest 0 0 0 0 0  Down, Depressed, Hopeless 2 0 0 0 0  PHQ - 2 Score 2 0 0 0 0  Altered sleeping 3 - 1 - -  Tired, decreased energy 1 - 1 - -  Change in appetite 1 - 1 - -  Feeling bad or failure about yourself  0 - 0 - -  Trouble concentrating 1 - 1 - -  Moving slowly or fidgety/restless 0 - 1 - -  Suicidal thoughts 0 - - - -  PHQ-9 Score 8 - 5 - -  Difficult doing work/chores Not difficult at all - - - -    Contractions: Irregular.  .  Movement: Present. denies leaking of fluid.  Review of Systems:   Pertinent items are noted in HPI Denies abnormal vaginal discharge w/ itching/odor/irritation, headaches, visual changes, shortness of breath, chest pain, abdominal pain, severe nausea/vomiting, or problems with urination or bowel movements unless otherwise stated above. Pertinent History Reviewed:  Reviewed past medical,surgical, social, obstetrical and family history.  Reviewed problem list, medications and allergies. Physical Assessment:   Vitals:   01/27/20 0945  BP: 116/77  Pulse: 85  Weight: 226 lb 6.4 oz (102.7 kg)  Body mass index is 38.86 kg/m.           Physical Examination:   General appearance: alert, well appearing, and in no distress  Mental status: alert, oriented to person, place, and time  Skin: warm & dry   Extremities: Edema: None    Cardiovascular: normal  heart rate noted  Respiratory: normal respiratory effort, no distress  Abdomen: gravid, soft, non-tender  Pelvic: Cervical exam deferred         Fetal Status: Fetal Heart Rate (bpm): 138 u/s   Movement: Present    Fetal Surveillance Testing today: [place] 02+7 wks,cephalic,BPP 2/5,DGU 440 bpm,posterior placenta gr 3,cx 3.7 cm,afi 13.2 cm,EFW 2782 g 99%,AC 99%  Chaperone: n/a    Results for orders placed or performed in visit on 01/27/20 (from the past 24 hour(s))  POC Urinalysis Dipstick OB   Collection Time: 01/27/20  9:44 AM  Result Value Ref Range   Color, UA     Clarity, UA     Glucose, UA Negative Negative   Bilirubin, UA     Ketones, UA neg    Spec Grav, UA     Blood, UA neg    pH, UA     POC,PROTEIN,UA Negative Negative, Trace, Small (1+), Moderate (2+), Large (3+), 4+   Urobilinogen, UA     Nitrite, UA neg    Leukocytes, UA Moderate (2+) (A) Negative   Appearance     Odor      Assessment & Plan:  1) High-risk pregnancy [id number] at [redacted]w[redacted]d with an Estimated Date of Delivery: 03/18/20   2) T2DM, unstable, increase AM metformin to 1,000mg , continue 1,000mg  qhs and glyburide 5mg  qhs. EFW 99% today, normal AFI. Discussed will  get EFW again @ 36wks, if >4500g will discuss considering PCS  3) Suspected fetal macrosomia> 99%/2782g today  3) H/O GHTN, stable  Meds: No orders of the defined types were placed in this encounter.   Labs/procedures today: u/s, tdap  Treatment Plan:  Weekly bpp as scheduled, EFW @ 36wks, deliver @ 39wks  Reviewed: Preterm labor symptoms and general obstetric precautions including but not limited to vaginal bleeding, contractions, leaking of fluid and fetal movement were reviewed in detail with the patient.  All questions were answered.  Follow-up: Return for As scheduled next week bpp & hrob.  Orders Placed This Encounter  Procedures  . US FETAL BPP WO NON STRESS  . Tdap vaccine greater than or equal to 7yo IM  . POC Urinalysis Dipstick OB    Cheral Marker CNM, [facility] 01/27/2020 10:25 AM

## 2020-01-27 NOTE — Patient Instructions (Addendum)
Kristy Fowler, I greatly value your feedback.  If you receive a survey following your visit with [place] today, we appreciate you taking the time to fill it out.  Thanks, Joellyn Haff, CNM, WHNP-BC   [facility] ([address] [place], [place] [address]) Entrance C, located off of [address] valet parking  Go to [email or web address] to register for FREE online childbirth classes   Increase metformin to 1,000mg  in AM  Call the office [phone]) or go to [facility] if:  You begin to have strong, frequent contractions  Your water breaks.  Sometimes it is a big gush of fluid, sometimes it is just a trickle that keeps getting your panties wet or running down your legs  You have vaginal bleeding.  It is normal to have a small amount of spotting if your cervix was checked.   You don't feel your baby moving like normal.  If you don't, get you something to eat and drink and lay down and focus on feeling your baby move.  You should feel at least 10 movements in 2 hours.  If you don't, you should call the office or go to [facility].    Tdap Vaccine  It is recommended that you get the Tdap vaccine during the third trimester of EACH pregnancy to help protect your baby from getting pertussis (whooping cough)  27-36 weeks is the BEST time to do this so that you can pass the protection on to your baby. During pregnancy is better than after pregnancy, but if you are unable to get it during pregnancy it will be offered at the hospital.   You can get this vaccine with [place], at the health department, your family doctor, or some local pharmacies  Everyone who will be around your baby should also be up-to-date on their vaccines before the baby comes. Adults (who are not pregnant) only need 1 dose of Tdap during adulthood.   [place] Pediatricians/Family Doctors:  [facility] [phone]            [facility] [phone]                  [facility] [phone] (usually not accepting new patients unless you have family there already, you are always welcome to call and ask)       [facility] Department [phone]       [facility] Pediatricians/Family Doctors:   [facility]: [phone]  [facility]: [phone]  [facility]: [phone]  [facility] Doctors:   [facility]: [phone]   [facility]: [phone]  [facility] Doctors:  [facility]: [phone]   Home Blood Pressure Monitoring for Patients   Your provider has recommended that you check your blood pressure (BP) at least once a week at home. If you do not have a blood pressure cuff at home, one will be provided for you. Contact your provider if you have not received your monitor within 1 week.   Helpful Tips for Accurate Home Blood Pressure Checks  . Don't smoke, exercise, or drink caffeine 30 minutes before checking your BP . Use the restroom before checking your BP (a full bladder can raise your pressure) . Relax in a comfortable upright chair . Feet on the ground . Left arm resting comfortably on a flat surface at the level of your heart . Legs uncrossed . Back supported . Sit quietly and don't  talk . Place the cuff on your bare arm . Adjust snuggly, so that only two fingertips can fit between your skin and the top of the cuff . Check 2 readings separated by at least one minute . Keep a log of your BP readings . For a visual, please reference this diagram: http://ccnc.care/bpdiagram  Provider Name: [facility]     Phone: [phone]  Zone 1: ALL CLEAR  Continue to monitor your symptoms:  . BP reading is less than 140 (top number) or less than 90 (bottom number)  . No right upper stomach pain . No headaches or seeing spots . No feeling nauseated or throwing up . No swelling in face and  hands  Zone 2: CAUTION Call your doctor's office for any of the following:  . BP reading is greater than 140 (top number) or greater than 90 (bottom number)  . Stomach pain under your ribs in the middle or right side . Headaches or seeing spots . Feeling nauseated or throwing up . Swelling in face and hands  Zone 3: EMERGENCY  Seek immediate medical care if you have any of the following:  . BP reading is greater than160 (top number) or greater than 110 (bottom number) . Severe headaches not improving with Tylenol . Serious difficulty catching your breath . Any worsening symptoms from Zone 2   Third Trimester of Pregnancy The third trimester is from week 29 through week 42, months 7 through 9. The third trimester is a time when the fetus is growing rapidly. At the end of the ninth month, the fetus is about 20 inches in length and weighs 6-10 pounds.  BODY CHANGES Your body goes through many changes during pregnancy. The changes vary from woman to woman.   Your weight will continue to increase. You can expect to gain 25-35 pounds (11-16 kg) by the end of the pregnancy.  You may begin to get stretch marks on your hips, abdomen, and breasts.  You may urinate more often because the fetus is moving lower into your pelvis and pressing on your bladder.  You may develop or continue to have heartburn as a result of your pregnancy.  You may develop constipation because certain hormones are causing the muscles that push waste through your intestines to slow down.  You may develop hemorrhoids or swollen, bulging veins (varicose veins).  You may have pelvic pain because of the weight gain and pregnancy hormones relaxing your joints between the bones in your pelvis. Backaches may result from overexertion of the muscles supporting your posture.  You may have changes in your hair. These can include thickening of your hair, rapid growth, and changes in texture. Some women also have hair loss during  or after pregnancy, or hair that feels dry or thin. Your hair will most likely return to normal after your baby is born.  Your breasts will continue to grow and be tender. A yellow discharge may leak from your breasts called colostrum.  Your belly button may stick out.  You may feel short of breath because of your expanding uterus.  You may notice the fetus "dropping," or moving lower in your abdomen.  You may have a bloody mucus discharge. This usually occurs a few days to a week before labor begins.  Your cervix becomes thin and soft (effaced) near your due date. WHAT TO EXPECT AT YOUR PRENATAL EXAMS  You will have prenatal exams every 2 weeks until week 36. Then, you will have weekly prenatal  exams. During a routine prenatal visit:  You will be weighed to make sure you and the fetus are growing normally.  Your blood pressure is taken.  Your abdomen will be measured to track your baby's growth.  The fetal heartbeat will be listened to.  Any test results from the previous visit will be discussed.  You may have a cervical check near your due date to see if you have effaced. At around 36 weeks, your caregiver will check your cervix. At the same time, your caregiver will also perform a test on the secretions of the vaginal tissue. This test is to determine if a type of bacteria, Group B streptococcus, is present. Your caregiver will explain this further. Your caregiver may ask you:  What your birth plan is.  How you are feeling.  If you are feeling the baby move.  If you have had any abnormal symptoms, such as leaking fluid, bleeding, severe headaches, or abdominal cramping.  If you have any questions. Other tests or screenings that may be performed during your third trimester include:  Blood tests that check for low iron levels (anemia).  Fetal testing to check the health, activity level, and growth of the fetus. Testing is done if you have certain medical conditions or if  there are problems during the pregnancy. FALSE LABOR You may feel small, irregular contractions that eventually go away. These are called Braxton Hicks contractions, or false labor. Contractions may last for hours, days, or even weeks before true labor sets in. If contractions come at regular intervals, intensify, or become painful, it is best to be seen by your caregiver.  SIGNS OF LABOR   Menstrual-like cramps.  Contractions that are 5 minutes apart or less.  Contractions that start on the top of the uterus and spread down to the lower abdomen and back.  A sense of increased pelvic pressure or back pain.  A watery or bloody mucus discharge that comes from the vagina. If you have any of these signs before the 37th week of pregnancy, call your caregiver right away. You need to go to the hospital to get checked immediately. HOME CARE INSTRUCTIONS   Avoid all smoking, herbs, alcohol, and unprescribed drugs. These chemicals affect the formation and growth of the baby.  Follow your caregiver's instructions regarding medicine use. There are medicines that are either safe or unsafe to take during pregnancy.  Exercise only as directed by your caregiver. Experiencing uterine cramps is a good sign to stop exercising.  Continue to eat regular, healthy meals.  Wear a good support bra for breast tenderness.  Do not use hot tubs, steam rooms, or saunas.  Wear your seat belt at all times when driving.  Avoid raw meat, uncooked cheese, cat litter boxes, and soil used by cats. These carry germs that can cause birth defects in the baby.  Take your prenatal vitamins.  Try taking a stool softener (if your caregiver approves) if you develop constipation. Eat more high-fiber foods, such as fresh vegetables or fruit and whole grains. Drink plenty of fluids to keep your urine clear or pale yellow.  Take warm sitz baths to soothe any pain or discomfort caused by hemorrhoids. Use hemorrhoid cream if your  caregiver approves.  If you develop varicose veins, wear support hose. Elevate your feet for 15 minutes, 3-4 times a day. Limit salt in your diet.  Avoid heavy lifting, wear low heal shoes, and practice good posture.  Rest a lot with your legs elevated  if you have leg cramps or low back pain.  Visit your dentist if you have not gone during your pregnancy. Use a soft toothbrush to brush your teeth and be gentle when you floss.  A sexual relationship may be continued unless your caregiver directs you otherwise.  Do not travel far distances unless it is absolutely necessary and only with the approval of your caregiver.  Take prenatal classes to understand, practice, and ask questions about the labor and delivery.  Make a trial run to the hospital.  Pack your hospital bag.  Prepare the baby's nursery.  Continue to go to all your prenatal visits as directed by your caregiver. SEEK MEDICAL CARE IF:  You are unsure if you are in labor or if your water has broken.  You have dizziness.  You have mild pelvic cramps, pelvic pressure, or nagging pain in your abdominal area.  You have persistent nausea, vomiting, or diarrhea.  You have a bad smelling vaginal discharge.  You have pain with urination. SEEK IMMEDIATE MEDICAL CARE IF:   You have a fever.  You are leaking fluid from your vagina.  You have spotting or bleeding from your vagina.  You have severe abdominal cramping or pain.  You have rapid weight loss or gain.  You have shortness of breath with chest pain.  You notice sudden or extreme swelling of your face, hands, ankles, feet, or legs.  You have not felt your baby move in over an hour.  You have severe headaches that do not go away with medicine.  You have vision changes. Document Released: 07/17/2001 Document Revised: 07/28/2013 Document Reviewed: 09/23/2012 [facility] Patient Information 2015 [place], [place]. This information is not intended to replace advice  given to you by your health care provider. Make sure you discuss any questions you have with your health care provider.

## 2020-01-29 ENCOUNTER — Inpatient Hospital Stay (HOSPITAL_COMMUNITY)
Admission: AD | Admit: 2020-01-29 | Discharge: 2020-01-29 | Disposition: A | Discharge status: Home / Self Care | Payer: Medicaid Other | Attending: Obstetrics and Gynecology | Admitting: Obstetrics and Gynecology

## 2020-01-29 ENCOUNTER — Encounter (HOSPITAL_COMMUNITY): Payer: Self-pay | Admitting: Obstetrics and Gynecology

## 2020-01-29 ENCOUNTER — Other Ambulatory Visit: Payer: Self-pay

## 2020-01-29 DIAGNOSIS — O26853 Spotting complicating pregnancy, third trimester: Secondary | ICD-10-CM | POA: Insufficient documentation

## 2020-01-29 DIAGNOSIS — Z7984 Long term (current) use of oral hypoglycemic drugs: Secondary | ICD-10-CM | POA: Insufficient documentation

## 2020-01-29 DIAGNOSIS — M797 Fibromyalgia: Secondary | ICD-10-CM | POA: Insufficient documentation

## 2020-01-29 DIAGNOSIS — Z3A33 33 weeks gestation of pregnancy: Secondary | ICD-10-CM

## 2020-01-29 DIAGNOSIS — E669 Obesity, unspecified: Secondary | ICD-10-CM | POA: Insufficient documentation

## 2020-01-29 DIAGNOSIS — O24113 Pre-existing diabetes mellitus, type 2, in pregnancy, third trimester: Secondary | ICD-10-CM | POA: Diagnosis not present

## 2020-01-29 DIAGNOSIS — O99891 Other specified diseases and conditions complicating pregnancy: Secondary | ICD-10-CM | POA: Diagnosis not present

## 2020-01-29 DIAGNOSIS — O99213 Obesity complicating pregnancy, third trimester: Secondary | ICD-10-CM | POA: Insufficient documentation

## 2020-01-29 DIAGNOSIS — Z79899 Other long term (current) drug therapy: Secondary | ICD-10-CM | POA: Insufficient documentation

## 2020-01-29 DIAGNOSIS — Z7982 Long term (current) use of aspirin: Secondary | ICD-10-CM | POA: Insufficient documentation

## 2020-01-29 DIAGNOSIS — O4703 False labor before 37 completed weeks of gestation, third trimester: Secondary | ICD-10-CM

## 2020-01-29 DIAGNOSIS — O2341 Unspecified infection of urinary tract in pregnancy, first trimester: Secondary | ICD-10-CM

## 2020-01-29 DIAGNOSIS — O99343 Other mental disorders complicating pregnancy, third trimester: Secondary | ICD-10-CM | POA: Insufficient documentation

## 2020-01-29 DIAGNOSIS — M549 Dorsalgia, unspecified: Secondary | ICD-10-CM | POA: Insufficient documentation

## 2020-01-29 DIAGNOSIS — O26893 Other specified pregnancy related conditions, third trimester: Secondary | ICD-10-CM | POA: Insufficient documentation

## 2020-01-29 DIAGNOSIS — Z8759 Personal history of other complications of pregnancy, childbirth and the puerperium: Secondary | ICD-10-CM

## 2020-01-29 DIAGNOSIS — F419 Anxiety disorder, unspecified: Secondary | ICD-10-CM | POA: Insufficient documentation

## 2020-01-29 DIAGNOSIS — Z3689 Encounter for other specified antenatal screening: Secondary | ICD-10-CM | POA: Diagnosis not present

## 2020-01-29 DIAGNOSIS — O0990 Supervision of high risk pregnancy, unspecified, unspecified trimester: Secondary | ICD-10-CM

## 2020-01-29 LAB — URINALYSIS, ROUTINE W REFLEX MICROSCOPIC
Bilirubin Urine: NEGATIVE
Glucose, UA: NEGATIVE mg/dL
Ketones, ur: 5 mg/dL — AB
Nitrite: NEGATIVE
Protein, ur: NEGATIVE mg/dL
Specific Gravity, Urine: 1.025 (ref 1.005–1.030)
pH: 5 (ref 5.0–8.0)

## 2020-01-29 LAB — WET PREP, GENITAL
Sperm: NONE SEEN
Trich, Wet Prep: NONE SEEN
Yeast Wet Prep HPF POC: NONE SEEN

## 2020-01-29 MED ORDER — LACTATED RINGERS IV BOLUS
1000.0000 mL | Freq: Once | INTRAVENOUS | Status: AC
  Administered 2020-01-29: 1000 mL via INTRAVENOUS

## 2020-01-29 MED ORDER — NIFEDIPINE 10 MG PO CAPS
10.0000 mg | ORAL_CAPSULE | ORAL | Status: AC
  Administered 2020-01-29 (×3): 10 mg via ORAL
  Filled 2020-01-29 (×3): qty 1

## 2020-01-29 NOTE — MAU Provider Note (Signed)
History     CSN: [id number]  Arrival date and time: 01/29/20 1649   First Provider Initiated Contact with Patient 01/29/20 1732      Chief Complaint  Patient presents with  . Vaginal Bleeding  . Contractions  . Back Pain   34 y.o. [id number] @33 .0 wks presenting for spotting and ctx. Reports more frequent BH ctx over the last 2 weeks. Unsure how often they were today because she was working and busy [employer]). Feels like period cramps. Reports seeing some pink mucus when she went to the [place] twice today. Denies vaginal discharge, itching, and malodor. Denies LOF. +FM. Her pregnancy is complicated by G2RK, hx of gHTN vs CHTN in previous pregnancy, depression, obesity, and UTI.   OB History    Gravida  4   Para  2   Term  2   Preterm      AB  1   Living  2     SAB      TAB  1   Ectopic      Multiple      Live Births  2           Past Medical History:  Diagnosis Date  . Anxiety   . BV (bacterial vaginosis) 08/24/2019   +BV on CV swab, 08/24/19 rx flagyl   . Carpal tunnel syndrome of right wrist   . Depression   . Diabetes mellitus without complication ([place])   . Fibromyalgia   . Wears glasses     Past Surgical History:  Procedure Laterality Date  . RECONSTRUCTION SURGERY'S FOR LEFT CLUB FOOT  x8  total , from child to last one 2012  . TRIGGER FINGER RELEASE Right 02/17/2015   Procedure: RIGHT CARPAL TUNNEL RELEASE;  Surgeon: Iran Planas, MD;  Location: [facility];  Service: Orthopedics;  Laterality: Right;    Family History  Problem Relation Age of Onset  . Hypertension Maternal Grandmother   . Hypertension Maternal Grandfather   . Stroke Maternal Grandfather   . Heart disease Maternal Grandfather   . Cancer Maternal Aunt        maternal great aunt  . Hypertension Mother   . Diabetes Mother     Social History   Tobacco Use  . Smoking status: Never Smoker  . Smokeless tobacco: Never Used  Vaping Use  . Vaping Use: Never used   Substance Use Topics  . Alcohol use: No  . Drug use: No    Allergies: No Known Allergies  Medications Prior to Admission  Medication Sig Dispense Refill Last Dose  . aspirin EC 81 MG tablet Take 81 mg by mouth daily.   01/28/2020 at Unknown time  . glyBURIDE (DIABETA) 5 MG tablet Take 1 tablet (5 mg total) by mouth at bedtime. 30 tablet 3 01/28/2020 at Unknown time  . metFORMIN (GLUCOPHAGE) 500 MG tablet Take 1 tablet (500 mg total) by mouth 2 (two) times daily with a meal. (Patient taking differently: Take 1,000 mg by mouth 2 (two) times daily with a meal. ) 60 tablet 6 01/29/2020 at Unknown time  . prenatal vitamin w/FE, FA (PRENATAL 1 + 1) 27-1 MG TABS tablet Take 1 tablet by mouth daily at 12 noon. 30 tablet 12 01/28/2020 at Unknown time  . sertraline (ZOLOFT) 25 MG tablet Take 1 tablet (25 mg total) by mouth daily. 30 tablet 6 01/29/2020 at Unknown time  . Blood Pressure Monitor MISC For regular home bp monitoring during pregnancy 1 each 0   .  docusate sodium (COLACE) 100 MG capsule Take 100 mg by mouth as needed for mild constipation. (Patient not taking: Reported on 01/27/2020)     . omeprazole (PRILOSEC) 20 MG capsule Take 1 capsule (20 mg total) by mouth daily. 1 tablet a day (Patient not taking: Reported on 01/27/2020) 30 capsule 6     Review of Systems  Gastrointestinal: Positive for abdominal pain (ctx).  Genitourinary: Positive for vaginal discharge. Negative for dysuria, frequency and hematuria.   Physical Exam   Blood pressure 126/76, pulse 79, temperature 97.9 F (36.6 C), temperature source Oral, resp. rate 18, height 5\' 4"  (1.626 m), weight 102.6 kg, last menstrual period 06/12/2019, SpO2 99 %.  Physical Exam Vitals and nursing note reviewed. Exam conducted with a chaperone present.  Constitutional:      General: She is not in acute distress. HENT:     Head: Normocephalic and atraumatic.  Cardiovascular:     Rate and Rhythm: Normal rate.  Pulmonary:     Effort:  Pulmonary effort is normal. No respiratory distress.  Abdominal:     Palpations: Abdomen is soft.     Tenderness: There is no abdominal tenderness.     Comments: gravid  Genitourinary:    Comments: External: no lesions or erythema Vagina: rugated, pink, moist, thin white discharge, no blood Cervix closed/thick  Musculoskeletal:        General: Normal range of motion.     Cervical back: Normal range of motion.  Skin:    General: Skin is warm and dry.  Neurological:     General: No focal deficit present.     Mental Status: She is alert and oriented to person, place, and time.  Psychiatric:        Mood and Affect: Mood normal.   EFM: 155 bpm, mod variability, +accels, no decels Toco: irritability  Results for orders placed or performed during the hospital encounter of 01/29/20 (from the past 24 hour(s))  Urinalysis, Routine w reflex microscopic     Status: Abnormal   Collection Time: 01/29/20  5:18 PM  Result Value Ref Range   Color, Urine YELLOW YELLOW   APPearance CLEAR CLEAR   Specific Gravity, Urine 1.025 1.005 - 1.030   pH 5.0 5.0 - 8.0   Glucose, UA NEGATIVE NEGATIVE mg/dL   Hgb urine dipstick SMALL (A) NEGATIVE   Bilirubin Urine NEGATIVE NEGATIVE   Ketones, ur 5 (A) NEGATIVE mg/dL   Protein, ur NEGATIVE NEGATIVE mg/dL   Nitrite NEGATIVE NEGATIVE   Leukocytes,Ua SMALL (A) NEGATIVE   RBC / HPF 6-10 0 - 5 RBC/hpf   WBC, UA 6-10 0 - 5 WBC/hpf   Bacteria, UA RARE (A) NONE SEEN   Squamous Epithelial / LPF 0-5 0 - 5   Mucus PRESENT   Wet prep, genital     Status: Abnormal   Collection Time: 01/29/20  5:45 PM   Specimen: PATH Cytology Cervicovaginal Ancillary Only  Result Value Ref Range   Yeast Wet Prep HPF POC NONE SEEN NONE SEEN   Trich, Wet Prep NONE SEEN NONE SEEN   Clue Cells Wet Prep HPF POC PRESENT (A) NONE SEEN   WBC, Wet Prep HPF POC MANY (A) NONE SEEN   Sperm NONE SEEN    MAU Course  Procedures Meds ordered this encounter  Medications  . lactated  ringers bolus 1,000 mL  . NIFEdipine (PROCARDIA) capsule 10 mg   MDM Labs ordered and reviewed.  1856: Reports ctx q5-6 min, will order Procardia and IVF.  1815: Pt feeling less ctx after meds and fluids, ctx irregular on toco, cervix unchanged. No evidence of PTL. Stable for discharge home.  Assessment and Plan  [redacted] weeks gestation Reactive NST Preterm contractions Discharge home Follow up at [facility] next week as scheduled PTL precautions  Allergies as of 01/29/2020   No Known Allergies     Medication List    STOP taking these medications   docusate sodium 100 MG capsule Commonly known as: COLACE   omeprazole 20 MG capsule Commonly known as: PRILOSEC     TAKE these medications   aspirin EC 81 MG tablet Take 81 mg by mouth daily.   Blood Pressure Monitor Misc For regular home bp monitoring during pregnancy   glyBURIDE 5 MG tablet Commonly known as: DIABETA Take 1 tablet (5 mg total) by mouth at bedtime.   metFORMIN 500 MG tablet Commonly known as: GLUCOPHAGE Take 1 tablet (500 mg total) by mouth 2 (two) times daily with a meal. What changed: how much to take   prenatal vitamin w/FE, FA 27-1 MG Tabs tablet Take 1 tablet by mouth daily at 12 noon.   sertraline 25 MG tablet Commonly known as: ZOLOFT Take 1 tablet (25 mg total) by mouth daily.      Donette Larry, CNM 01/29/2020, 8:24 PM

## 2020-01-29 NOTE — Discharge Instructions (Signed)
Braxton Hicks Contractions °Contractions of the uterus can occur throughout pregnancy, but they are not always a sign that you are in labor. You may have practice contractions called Braxton Hicks contractions. These false labor contractions are sometimes confused with true labor. °What are Braxton Hicks contractions? °Braxton Hicks contractions are tightening movements that occur in the muscles of the uterus before labor. Unlike true labor contractions, these contractions do not result in opening (dilation) and thinning of the cervix. Toward the end of pregnancy (32-34 weeks), Braxton Hicks contractions can happen more often and may become stronger. These contractions are sometimes difficult to tell apart from true labor because they can be very uncomfortable. You should not feel embarrassed if you go to the hospital with false labor. °Sometimes, the only way to tell if you are in true labor is for your health care provider to look for changes in the cervix. The health care provider will do a physical exam and may monitor your contractions. If you are not in true labor, the exam should show that your cervix is not dilating and your water has not broken. °If there are no other health problems associated with your pregnancy, it is completely safe for you to be sent home with false labor. You may continue to have Braxton Hicks contractions until you go into true labor. °How to tell the difference between true labor and false labor °True labor °· Contractions last 30-70 seconds. °· Contractions become very regular. °· Discomfort is usually felt in the top of the uterus, and it spreads to the lower abdomen and low back. °· Contractions do not go away with walking. °· Contractions usually become more intense and increase in frequency. °· The cervix dilates and gets thinner. °False labor °· Contractions are usually shorter and not as strong as true labor contractions. °· Contractions are usually irregular. °· Contractions  are often felt in the front of the lower abdomen and in the groin. °· Contractions may go away when you walk around or change positions while lying down. °· Contractions get weaker and are shorter-lasting as time goes on. °· The cervix usually does not dilate or become thin. °Follow these instructions at home: ° °· Take over-the-counter and prescription medicines only as told by your health care provider. °· Keep up with your usual exercises and follow other instructions from your health care provider. °· Eat and drink lightly if you think you are going into labor. °· If Braxton Hicks contractions are making you uncomfortable: °? Change your position from lying down or resting to walking, or change from walking to resting. °? Sit and rest in a tub of warm water. °? Drink enough fluid to keep your urine pale yellow. Dehydration may cause these contractions. °? Do slow and deep breathing several times an hour. °· Keep all follow-up prenatal visits as told by your health care provider. This is important. °Contact a health care provider if: °· You have a fever. °· You have continuous pain in your abdomen. °Get help right away if: °· Your contractions become stronger, more regular, and closer together. °· You have fluid leaking or gushing from your vagina. °· You pass blood-tinged mucus (bloody show). °· You have bleeding from your vagina. °· You have low back pain that you never had before. °· You feel your baby’s head pushing down and causing pelvic pressure. °· Your baby is not moving inside you as much as it used to. °Summary °· Contractions that occur before labor are   called Braxton Hicks contractions, false labor, or practice contractions. °· Braxton Hicks contractions are usually shorter, weaker, farther apart, and less regular than true labor contractions. True labor contractions usually become progressively stronger and regular, and they become more frequent. °· Manage discomfort from Braxton Hicks contractions  by changing position, resting in a warm bath, drinking plenty of water, or practicing deep breathing. °This information is not intended to replace advice given to you by your health care provider. Make sure you discuss any questions you have with your health care provider. °Document Revised: 07/05/2017 Document Reviewed: 12/06/2016 °Elsevier Patient Education © 2020 [employer] ° °

## 2020-01-29 NOTE — MAU Note (Signed)
Last night was having contractions.  Went to work today.   Noted pinkish mucous d/c a couple times when used restroom today.  Has noted some contractions today(like mild period cramps).

## 2020-02-02 LAB — GC/CHLAMYDIA PROBE AMP (~~LOC~~) NOT AT ARMC
Chlamydia: NEGATIVE
Comment: NEGATIVE
Comment: NORMAL
Neisseria Gonorrhea: NEGATIVE

## 2020-02-03 ENCOUNTER — Ambulatory Visit (INDEPENDENT_AMBULATORY_CARE_PROVIDER_SITE_OTHER): Payer: Medicaid Other | Admitting: Obstetrics and Gynecology

## 2020-02-03 ENCOUNTER — Ambulatory Visit (INDEPENDENT_AMBULATORY_CARE_PROVIDER_SITE_OTHER): Payer: Medicaid Other

## 2020-02-03 ENCOUNTER — Other Ambulatory Visit: Payer: Self-pay

## 2020-02-03 VITALS — BP 120/78 | HR 88 | Wt 225.8 lb

## 2020-02-03 DIAGNOSIS — E119 Type 2 diabetes mellitus without complications: Secondary | ICD-10-CM

## 2020-02-03 DIAGNOSIS — O0993 Supervision of high risk pregnancy, unspecified, third trimester: Secondary | ICD-10-CM | POA: Diagnosis not present

## 2020-02-03 DIAGNOSIS — Z3A33 33 weeks gestation of pregnancy: Secondary | ICD-10-CM

## 2020-02-03 DIAGNOSIS — O133 Gestational [pregnancy-induced] hypertension without significant proteinuria, third trimester: Secondary | ICD-10-CM

## 2020-02-03 DIAGNOSIS — O2343 Unspecified infection of urinary tract in pregnancy, third trimester: Secondary | ICD-10-CM

## 2020-02-03 DIAGNOSIS — Z8759 Personal history of other complications of pregnancy, childbirth and the puerperium: Secondary | ICD-10-CM

## 2020-02-03 DIAGNOSIS — Z331 Pregnant state, incidental: Secondary | ICD-10-CM

## 2020-02-03 DIAGNOSIS — O0990 Supervision of high risk pregnancy, unspecified, unspecified trimester: Secondary | ICD-10-CM

## 2020-02-03 DIAGNOSIS — Z1389 Encounter for screening for other disorder: Secondary | ICD-10-CM

## 2020-02-03 DIAGNOSIS — O2341 Unspecified infection of urinary tract in pregnancy, first trimester: Secondary | ICD-10-CM

## 2020-02-03 DIAGNOSIS — O24113 Pre-existing diabetes mellitus, type 2, in pregnancy, third trimester: Secondary | ICD-10-CM

## 2020-02-03 DIAGNOSIS — Z794 Long term (current) use of insulin: Secondary | ICD-10-CM

## 2020-02-03 NOTE — Progress Notes (Signed)
[place] 33+5 wks,cephalic,BPP 8/8,posterior placenta gr 2,afi 14 cm,fhr 164 bpm

## 2020-02-03 NOTE — Progress Notes (Signed)
PATIENT ID: Kristy Fowler, female     DOB: 22-Jan-1986, 34 y.o.     MRN: [id number]     [facility] PREGNANCY VISIT PATIENT NAME: Kristy Fowler MRN [id number]  DOB: [birth year] Chief Complaint:   Routine Prenatal Visit  History of Present Illness:   Kristy Fowler is a 34 y.o. [phone] female at [redacted]w[redacted]d with an Estimated Date of Delivery: 03/18/20 being seen today for ongoing management of a high-risk pregnancy complicated by diabetes mellitus T2DM currently on metforming 500mg  AM/1000mg  qhs, glyburide 5mg  qhs.  Today she reports no complaints.   She continues to work. She is currently working 20 hrs per week .  Pt at home blood sugars reported:     Depression screen [facility] 2/9 12/24/2019 10/29/2019 09/09/2019 02/18/2019 12/19/2016  Decreased Interest 0 0 0 0 0  Down, Depressed, Hopeless 2 0 0 0 0  PHQ - 2 Score 2 0 0 0 0  Altered sleeping 3 - 1 - -  Tired, decreased energy 1 - 1 - -  Change in appetite 1 - 1 - -  Feeling bad or failure about yourself  0 - 0 - -  Trouble concentrating 1 - 1 - -  Moving slowly or fidgety/restless 0 - 1 - -  Suicidal thoughts 0 - - - -  PHQ-9 Score 8 - 5 - -  Difficult doing work/chores Not difficult at all - - - -    Contractions: Irregular. Vag. Bleeding: None.  Movement: Present. denies leaking of fluid.   Review of Systems:   Pertinent items are noted in HPI Denies abnormal vaginal discharge w/ itching/odor/irritation, headaches, visual changes, shortness of breath, chest pain, abdominal pain, severe nausea/vomiting, or problems with urination or bowel movements unless otherwise stated above.  Pertinent History Reviewed:  Reviewed past medical,surgical, social, obstetrical and family history.  Reviewed problem list, medications and allergies.  Physical Assessment:   Vitals:   02/03/20 0939  BP: 120/78  Pulse: 88  Weight: 225 lb 12.8 oz (102.4 kg)  Body mass index is 38.76 kg/m.       Physical Examination:   General appearance:  alert, well appearing, and in no distress, oriented to person, place, and time, normal appearing weight and well hydrated  Mental status: alert, oriented to person, place, and time, normal mood, behavior, speech, dress, motor activity, and thought processes, affect appropriate to mood  Skin: warm & dry   Extremities: Edema: Trace    Cardiovascular: normal heart rate noted  Respiratory: normal respiratory effort, no distress  Abdomen: gravid, soft, non-tender  Pelvic: Cervical exam deferred         Fetal Status:     Movement: Present    Fetal Surveillance Testing today: BPP 8/8   Chaperone: 02/05/20    No results found for this or any previous visit (from the past 24 hour(s)).   Assessment & Plan:  1) High-risk pregnancy 10/8 at [redacted]w[redacted]d with an Estimated Date of Delivery: 03/18/20   2) ghtn STABLE, CURRENTLY NOT MEETING BP CRITERIA  3) dma 2, stable  Meds: No orders of the defined types were placed in this encounter.   Labs/procedures today: BPP  Treatment Plan:   WEEKLY BPP  Reviewed: Term labor symptoms and general obstetric precautions including but not limited to vaginal bleeding, contractions, leaking of fluid and fetal movement were reviewed in detail with the patient.  All questions were answered. Check bp weekly, let [redacted]w[redacted]d know if >140/90.   Follow-up: WEEK  By signing my name below, I, [employer], attest that this documentation has been prepared under the direction and in the presence of Tilda Burrow, MD. Electronically Signed: Mal Misty Medical Scribe. 02/03/20. 10:06 AM.  I personally performed the services described in this documentation, which was SCRIBED in my presence. The recorded information has been reviewed and considered accurate. It has been edited as necessary during review. Tilda Burrow, MD

## 2020-02-04 DIAGNOSIS — F419 Anxiety disorder, unspecified: Secondary | ICD-10-CM | POA: Diagnosis not present

## 2020-02-09 ENCOUNTER — Other Ambulatory Visit: Payer: Self-pay | Admitting: Obstetrics and Gynecology

## 2020-02-09 DIAGNOSIS — O24419 Gestational diabetes mellitus in pregnancy, unspecified control: Secondary | ICD-10-CM

## 2020-02-10 ENCOUNTER — Ambulatory Visit (INDEPENDENT_AMBULATORY_CARE_PROVIDER_SITE_OTHER): Payer: Medicaid Other | Admitting: Student

## 2020-02-10 ENCOUNTER — Ambulatory Visit (INDEPENDENT_AMBULATORY_CARE_PROVIDER_SITE_OTHER): Payer: Medicaid Other

## 2020-02-10 ENCOUNTER — Encounter: Payer: Self-pay | Admitting: Student

## 2020-02-10 VITALS — BP 116/80 | HR 86 | Wt 227.5 lb

## 2020-02-10 DIAGNOSIS — Z331 Pregnant state, incidental: Secondary | ICD-10-CM

## 2020-02-10 DIAGNOSIS — O2341 Unspecified infection of urinary tract in pregnancy, first trimester: Secondary | ICD-10-CM

## 2020-02-10 DIAGNOSIS — O0990 Supervision of high risk pregnancy, unspecified, unspecified trimester: Secondary | ICD-10-CM

## 2020-02-10 DIAGNOSIS — O0993 Supervision of high risk pregnancy, unspecified, third trimester: Secondary | ICD-10-CM

## 2020-02-10 DIAGNOSIS — Z8759 Personal history of other complications of pregnancy, childbirth and the puerperium: Secondary | ICD-10-CM | POA: Diagnosis not present

## 2020-02-10 DIAGNOSIS — Z1389 Encounter for screening for other disorder: Secondary | ICD-10-CM

## 2020-02-10 DIAGNOSIS — Z3A34 34 weeks gestation of pregnancy: Secondary | ICD-10-CM

## 2020-02-10 DIAGNOSIS — O2343 Unspecified infection of urinary tract in pregnancy, third trimester: Secondary | ICD-10-CM | POA: Diagnosis not present

## 2020-02-10 DIAGNOSIS — O24419 Gestational diabetes mellitus in pregnancy, unspecified control: Secondary | ICD-10-CM

## 2020-02-10 LAB — POCT URINALYSIS DIPSTICK OB
Blood, UA: NEGATIVE
Glucose, UA: NEGATIVE
Ketones, UA: NEGATIVE
Nitrite, UA: NEGATIVE
POC,PROTEIN,UA: NEGATIVE

## 2020-02-10 NOTE — Addendum Note (Signed)
Addended by: Colen Darling on: 02/10/2020 10:36 AM   Modules accepted: Orders

## 2020-02-10 NOTE — Progress Notes (Signed)
[place] 34+5 wks,cephalic,BPP 8/8,FHR 132 bpm,posterior placenta gr 3,AFI 11 cm

## 2020-02-10 NOTE — Progress Notes (Signed)
   PRENATAL VISIT NOTE  Subjective:  Kristy Fowler is a 34 y.o. [phone] at [redacted]w[redacted]d being seen today for ongoing prenatal care.  She is currently monitored for the following issues for this low-risk pregnancy and has Supervision of high risk pregnancy, antepartum; Diabetes mellitus without complication (HCC); History of gestational hypertension; UTI (urinary tract infection) during pregnancy, first trimester; and Depression with anxiety on their problem list.  Patient reports occasional contractions.  Reports that BP has been normal at home. Sugars reviewed; patient has been checking twice a day. No problems checking fasting and dinner but harder to check breakfast and lunch.  Contractions: Irregular. Vag. Bleeding: None.  Movement: Present. Denies leaking of fluid.   The following portions of the patient's history were reviewed and updated as appropriate: allergies, current medications, past family history, past medical history, past social history, past surgical history and problem list.   Objective:   Vitals:   02/10/20 0944  BP: 116/80  Pulse: 86  Weight: 227 lb 8 oz (103.2 kg)    Fetal Status:     Movement: Present     General:  Alert, oriented and cooperative. Patient is in no acute distress.  Skin: Skin is warm and dry. No rash noted.   Cardiovascular: Normal heart rate noted  Respiratory: Normal respiratory effort, no problems with respiration noted  Abdomen: Soft, gravid, appropriate for gestational age.  Pain/Pressure: Present     Pelvic: Cervical exam deferred        Extremities: Normal range of motion.  Edema: None  Mental Status: Normal mood and affect. Normal behavior. Normal judgment and thought content.   Assessment and Plan:  Pregnancy: [id number] at [redacted]w[redacted]d 1. Supervision of high risk pregnancy, antepartum -Blood sugars: normal fastings, dinner are normal over the past 7 days except for one elevated at 155. Patient agrees to continue monitoring at least 3 times a day;  emphasized the importance of daily blood sugar monitoring in order to make sure her diabetes is under control.  2. Screening for genitourinary condition - POC Urinalysis Dipstick OB  3. Pregnant state, incidental  - POC Urinalysis Dipstick OB  4. [redacted] weeks gestation of pregnancy  - POC Urinalysis Dipstick OB  Preterm labor symptoms and general obstetric precautions including but not limited to vaginal bleeding, contractions, leaking of fluid and fetal movement were reviewed in detail with the patient. Please refer to After Visit Summary for other counseling recommendations.   No follow-ups on file.  Future Appointments  Date Time Provider Department Center  02/17/2020  9:00 AM [facility] - [facility] [place] [facility] None  02/17/2020  9:50 AM Thressa Sheller D, CNM [facility]  02/24/2020  9:00 AM [facility] - [facility] [place] [facility] None  02/24/2020  9:50 AM Tilda Burrow, MD [facility]  03/02/2020  9:00 AM [facility] - [facility] [place] [facility] None  03/02/2020  9:50 AM Tilda Burrow, MD [facility]  03/09/2020  9:00 AM [facility] - [facility] [place] [facility] None  03/09/2020  9:50 AM Tilda Burrow, MD [facility]  03/16/2020  9:00 AM [facility] - [facility] [place] [facility] None  03/16/2020  9:50 AM Tilda Burrow, MD [facility]    Marylene Land, CNM

## 2020-02-16 ENCOUNTER — Other Ambulatory Visit: Payer: Self-pay | Admitting: Student

## 2020-02-16 DIAGNOSIS — O24410 Gestational diabetes mellitus in pregnancy, diet controlled: Secondary | ICD-10-CM

## 2020-02-17 ENCOUNTER — Ambulatory Visit (INDEPENDENT_AMBULATORY_CARE_PROVIDER_SITE_OTHER): Payer: Medicaid Other

## 2020-02-17 ENCOUNTER — Ambulatory Visit (INDEPENDENT_AMBULATORY_CARE_PROVIDER_SITE_OTHER): Payer: Medicaid Other | Admitting: Advanced Practice Midwife

## 2020-02-17 ENCOUNTER — Encounter: Payer: Self-pay | Admitting: Advanced Practice Midwife

## 2020-02-17 VITALS — BP 136/83 | HR 83 | Wt 231.5 lb

## 2020-02-17 DIAGNOSIS — Z1389 Encounter for screening for other disorder: Secondary | ICD-10-CM

## 2020-02-17 DIAGNOSIS — O09293 Supervision of pregnancy with other poor reproductive or obstetric history, third trimester: Secondary | ICD-10-CM

## 2020-02-17 DIAGNOSIS — Z331 Pregnant state, incidental: Secondary | ICD-10-CM

## 2020-02-17 DIAGNOSIS — Z3A35 35 weeks gestation of pregnancy: Secondary | ICD-10-CM

## 2020-02-17 DIAGNOSIS — O24113 Pre-existing diabetes mellitus, type 2, in pregnancy, third trimester: Secondary | ICD-10-CM

## 2020-02-17 DIAGNOSIS — O24410 Gestational diabetes mellitus in pregnancy, diet controlled: Secondary | ICD-10-CM

## 2020-02-17 DIAGNOSIS — E119 Type 2 diabetes mellitus without complications: Secondary | ICD-10-CM

## 2020-02-17 LAB — POCT URINALYSIS DIPSTICK OB
Blood, UA: NEGATIVE
Glucose, UA: NEGATIVE
Ketones, UA: NEGATIVE
Leukocytes, UA: NEGATIVE
Nitrite, UA: NEGATIVE
POC,PROTEIN,UA: NEGATIVE

## 2020-02-17 NOTE — Progress Notes (Signed)
[place] 35+5 wks,cephalic,BPP 8/8,FHR 123 BPM,posterior placenta gr 3,afi 16 cm

## 2020-02-17 NOTE — Progress Notes (Signed)
   PRENATAL VISIT NOTE  Subjective:  Kristy Fowler is a 34 y.o. [phone] at [redacted]w[redacted]d being seen today for ongoing prenatal care.  She is currently monitored for the following issues for this high-risk pregnancy and has Supervision of high risk pregnancy, antepartum; Diabetes mellitus without complication (HCC); History of gestational hypertension; UTI (urinary tract infection) during pregnancy, first trimester; and Depression with anxiety on their problem list.  Patient reports no complaints.  Contractions: Irregular. Vag. Bleeding: None.  Movement: Present. Denies leaking of fluid.   The following portions of the patient's history were reviewed and updated as appropriate: allergies, current medications, past family history, past medical history, past social history, past surgical history and problem list.   Objective:   Vitals:   02/17/20 0957  BP: 136/83  Pulse: 83  Weight: 231 lb 8 oz (105 kg)    Fetal Status: Fetal Heart Rate (bpm): 123 Fundal Height: 35 cm Movement: Present  Presentation: Vertex  General:  Alert, oriented and cooperative. Patient is in no acute distress.  Skin: Skin is warm and dry. No rash noted.   Cardiovascular: Normal heart rate noted  Respiratory: Normal respiratory effort, no problems with respiration noted  Abdomen: Soft, gravid, appropriate for gestational age.  Pain/Pressure: Present     Pelvic: Cervical exam performed in the presence of a chaperone Dilation: 1 Effacement (%): Thick Station: -3  Extremities: Normal range of motion.  Edema: None  Mental Status: Normal mood and affect. Normal behavior. Normal judgment and thought content.   Glucose log: 76, 113, 100 78, 90, 95 76, 123, 104, 102 56, 76, 94, 123 65  Assessment and Plan:  Pregnancy: [id number] at [redacted]w[redacted]d 1. Pregnant state, incidental - POC Urinalysis Dipstick OB - blood glucose log reviewed, and looks perfect! - BPP 8/8 today    Preterm labor symptoms and general obstetric  precautions including but not limited to vaginal bleeding, contractions, leaking of fluid and fetal movement were reviewed in detail with the patient. Please refer to After Visit Summary for other counseling recommendations.   No follow-ups on file.  Future Appointments  Date Time Provider Department Center  02/24/2020  9:00 AM [facility] - [facility] [place] [facility] None  02/24/2020  9:50 AM Tilda Burrow, MD [facility]  03/02/2020  9:00 AM [facility] - [facility] [place] [facility] None  03/02/2020  9:50 AM Tilda Burrow, MD [facility]  03/09/2020  9:00 AM [facility] - [facility] [place] [facility] None  03/09/2020  9:50 AM Tilda Burrow, MD [facility]  03/16/2020  9:00 AM [facility] - [facility] [place] [facility] None  03/16/2020  9:50 AM Tilda Burrow, MD [facility]    Thressa Sheller DNP, CNM  02/17/20  10:25 AM

## 2020-02-23 ENCOUNTER — Other Ambulatory Visit: Payer: Self-pay | Admitting: Obstetrics and Gynecology

## 2020-02-23 ENCOUNTER — Other Ambulatory Visit: Payer: Self-pay | Admitting: Advanced Practice Midwife

## 2020-02-23 DIAGNOSIS — O24113 Pre-existing diabetes mellitus, type 2, in pregnancy, third trimester: Secondary | ICD-10-CM

## 2020-02-24 ENCOUNTER — Encounter: Payer: Self-pay | Admitting: Obstetrics and Gynecology

## 2020-02-24 ENCOUNTER — Ambulatory Visit (INDEPENDENT_AMBULATORY_CARE_PROVIDER_SITE_OTHER): Payer: Medicaid Other

## 2020-02-24 ENCOUNTER — Ambulatory Visit (INDEPENDENT_AMBULATORY_CARE_PROVIDER_SITE_OTHER): Payer: Medicaid Other | Admitting: Obstetrics and Gynecology

## 2020-02-24 VITALS — BP 126/88 | HR 81 | Wt 232.6 lb

## 2020-02-24 DIAGNOSIS — O26893 Other specified pregnancy related conditions, third trimester: Secondary | ICD-10-CM

## 2020-02-24 DIAGNOSIS — Z1389 Encounter for screening for other disorder: Secondary | ICD-10-CM

## 2020-02-24 DIAGNOSIS — O24113 Pre-existing diabetes mellitus, type 2, in pregnancy, third trimester: Secondary | ICD-10-CM

## 2020-02-24 DIAGNOSIS — Z3A36 36 weeks gestation of pregnancy: Secondary | ICD-10-CM | POA: Diagnosis not present

## 2020-02-24 DIAGNOSIS — O0993 Supervision of high risk pregnancy, unspecified, third trimester: Secondary | ICD-10-CM

## 2020-02-24 DIAGNOSIS — N898 Other specified noninflammatory disorders of vagina: Secondary | ICD-10-CM

## 2020-02-24 DIAGNOSIS — O2343 Unspecified infection of urinary tract in pregnancy, third trimester: Secondary | ICD-10-CM | POA: Diagnosis not present

## 2020-02-24 DIAGNOSIS — Z8759 Personal history of other complications of pregnancy, childbirth and the puerperium: Secondary | ICD-10-CM | POA: Diagnosis not present

## 2020-02-24 DIAGNOSIS — O0990 Supervision of high risk pregnancy, unspecified, unspecified trimester: Secondary | ICD-10-CM

## 2020-02-24 DIAGNOSIS — O2341 Unspecified infection of urinary tract in pregnancy, first trimester: Secondary | ICD-10-CM

## 2020-02-24 DIAGNOSIS — Z331 Pregnant state, incidental: Secondary | ICD-10-CM

## 2020-02-24 LAB — POCT URINALYSIS DIPSTICK OB
Blood, UA: NEGATIVE
Glucose, UA: NEGATIVE
Ketones, UA: NEGATIVE
Nitrite, UA: NEGATIVE
POC,PROTEIN,UA: NEGATIVE

## 2020-02-24 LAB — POCT WET PREP WITH KOH
Clue Cells Wet Prep HPF POC: POSITIVE
KOH Prep POC: NEGATIVE
Trichomonas, UA: NEGATIVE
Yeast Wet Prep HPF POC: POSITIVE

## 2020-02-24 MED ORDER — METRONIDAZOLE 500 MG PO TABS
500.0000 mg | ORAL_TABLET | Freq: Two times a day (BID) | ORAL | 0 refills | Status: AC
Start: 2020-02-24 — End: 2020-03-02

## 2020-02-24 MED ORDER — FLUCONAZOLE 150 MG PO TABS: 150.0000 mg | ORAL_TABLET | ORAL | 1 refills | Status: DC

## 2020-02-24 NOTE — Addendum Note (Signed)
Addended by: Colen Darling on: 02/24/2020 11:32 AM   Modules accepted: Orders

## 2020-02-24 NOTE — Progress Notes (Signed)
Patient ID: DOLLYE GLASSER, female   DOB: [birth year], 34 y.o.   MRN: [id number]    [facility] PREGNANCY VISIT Patient name: Kristy Fowler MRN [id number]  Date of birth: 07-03-1986 Chief Complaint:   High Risk Gestation ([place] today; GBS)  History of Present Illness:   Kristy Fowler is a 34 y.o. [phone] female at [redacted]w[redacted]d with an Estimated Date of Delivery: 03/18/20 being seen today for ongoing management of a high-risk pregnancy complicated by T2DM on Metformin 500 mg BID and Glyburide 5 mg at night Today she reports that her discharge has been a little bit darker lately. Denies itching. Her fasting blood sugars have mostly been 60-70, one was 193. She had one post prandial of 177 but the rest are okay. This is her third pregnancy.  Contractions: Irregular. Vag. Bleeding: None.  Movement: Present. denies leaking of fluid.  Review of Systems:   Pertinent items are noted in HPI Denies abnormal vaginal discharge w/ itching/odor/irritation, headaches, visual changes, shortness of breath, chest pain, abdominal pain, severe nausea/vomiting, or problems with urination or bowel movements unless otherwise stated above. Pertinent History Reviewed:  Reviewed past medical,surgical, social, obstetrical and family history.  Reviewed problem list, medications and allergies. Physical Assessment:   Vitals:   02/24/20 1014  BP: 126/88  Pulse: 81  Weight: 232 lb 9.6 oz (105.5 kg)  Body mass index is 39.93 kg/m.           Physical Examination:   General appearance: alert, well appearing, and in no distress and oriented to person, place, and time  Mental status: alert, oriented to person, place, and time, normal mood, behavior, speech, dress, motor activity, and thought processes  Skin: warm & dry   Extremities: Edema: None    Cardiovascular: normal heart rate noted  Respiratory: normal respiratory effort, no distress  Abdomen: gravid, soft, non-tender  Pelvic: KOH and wet prep: Yeast infection  and BV  Cervical exam performed  Dilation: 1.5 Effacement (%): 50    Fetal Status: Fetal Heart Rate (bpm): 150 u/s Fundal Height: 36 cm Movement: Present Presentation: Vertex  Fetal Surveillance Testing today: [place] 36+5 wks,cephalic,fhr 150 bpm,BPP 8/8,posterior placenta gr 3,afi 14.7 cm,EFW 3665 g 96%  Results for orders placed or performed in visit on 02/24/20 (from the past 24 hour(s))  POC Urinalysis Dipstick OB   Collection Time: 02/24/20 10:09 AM  Result Value Ref Range   Color, UA     Clarity, UA     Glucose, UA Negative Negative   Bilirubin, UA     Ketones, UA neg    Spec Grav, UA     Blood, UA neg    pH, UA     POC,PROTEIN,UA Negative Negative, Trace, Small (1+), Moderate (2+), Large (3+), 4+   Urobilinogen, UA     Nitrite, UA neg    Leukocytes, UA Trace (A) Negative   Appearance     Odor      Assessment & Plan:  1) High-risk pregnancy [id number] at [redacted]w[redacted]d with an Estimated Date of Delivery: 03/18/20   2) T2DM, stable on Metformin 500 mg BID and Glyburide 5 mg at night  3) Yeast infection, Rx Diflucan  4) BV, Rx Metronidazole BID x 7 days  Meds:  Meds ordered this encounter  Medications  . fluconazole (DIFLUCAN) 150 MG tablet    Sig: Take 1 tablet (150 mg total) by mouth every 3 (three) days.    Dispense:  2 tablet    Refill:  1  .  metroNIDAZOLE (FLAGYL) 500 MG tablet    Sig: Take 1 tablet (500 mg total) by mouth 2 (two) times daily for 7 days.    Dispense:  14 tablet    Refill:  0   Labs/procedures today: GBS  Treatment Plan:  Weekly BPP IOL at 39 wk  Reviewed: Term labor symptoms and general obstetric precautions including but not limited to vaginal bleeding, contractions, leaking of fluid and fetal movement were reviewed in detail with the patient.  All questions were answered. Check bp weekly, let us know if >140/90.   Follow-up: Return in about 1 week (around 03/02/2020) for HROB, U/S: BPP.  By signing my name below, I, Pietro Cassis, attest that this  documentation has been prepared under the direction and in the presence of Tilda Burrow, MD. Electronically Signed: Pietro Cassis, Medical Scribe. 02/24/20. 10:49 AM.  I personally performed the services described in this documentation, which was SCRIBED in my presence. The recorded information has been reviewed and considered accurate. It has been edited as necessary during review. Tilda Burrow, MD

## 2020-02-24 NOTE — Progress Notes (Signed)
[place] 36+5 wks,cephalic,fhr 150 bpm,BPP 8/8,posterior placenta gr 3,afi 14.7 cm,EFW 3665 g 96%

## 2020-02-26 LAB — STREP GP B NAA: Strep Gp B NAA: NEGATIVE

## 2020-02-28 NOTE — Progress Notes (Signed)
Gbs neg

## 2020-02-29 ENCOUNTER — Telehealth: Payer: Self-pay | Admitting: Obstetrics and Gynecology

## 2020-02-29 NOTE — Telephone Encounter (Signed)
Pt has misplaced her Diflucan. Took 1 tab but was prescribed 2 tabs. I advised 1 tab may take care of her symptoms and she may not need the second one. Pt voiced understanding. She has an appt on Wednesday so will follow up then. JSY

## 2020-02-29 NOTE — Telephone Encounter (Signed)
Patient called stating that she seen Dr. Emelda Fear last week and he provided a medication for her yeast infection. Pt states that he prescribed two and she thinks she through one away. Pt would like to know if we could call it back in for her at the pharmacy on file. Please contact pt

## 2020-03-01 ENCOUNTER — Other Ambulatory Visit: Payer: Self-pay | Admitting: Obstetrics and Gynecology

## 2020-03-01 DIAGNOSIS — O24419 Gestational diabetes mellitus in pregnancy, unspecified control: Secondary | ICD-10-CM

## 2020-03-01 NOTE — Progress Notes (Addendum)
PATIENT ID: Kristy Fowler, female     DOB: Mar 06, 1986, 34 y.o.     MRN: [id number]     [facility] PREGNANCY VISIT PATIENT NAME: Kristy Fowler MRN [id number]  DOB: [birth year] Chief Complaint:   No chief complaint on file.   History of Present Illness:   Kristy Fowler is a 34 y.o. [phone] female at [redacted]w[redacted]d with an Estimated Date of Delivery: 03/18/20 being seen today for ongoing management of a high-risk pregnancy complicated by diabetes mellitus T2DM currently on Metformin 500 mg BID and Glyburide 5 mg at night.  Today she reports no complaints.   Depression screen [facility] 2/9 12/24/2019 10/29/2019 09/09/2019 02/18/2019 12/19/2016  Decreased Interest 0 0 0 0 0  Down, Depressed, Hopeless 2 0 0 0 0  PHQ - 2 Score 2 0 0 0 0  Altered sleeping 3 - 1 - -  Tired, decreased energy 1 - 1 - -  Change in appetite 1 - 1 - -  Feeling bad or failure about yourself  0 - 0 - -  Trouble concentrating 1 - 1 - -  Moving slowly or fidgety/restless 0 - 1 - -  Suicidal thoughts 0 - - - -  PHQ-9 Score 8 - 5 - -  Difficult doing work/chores Not difficult at all - - - -     .  .   . denies leaking of fluid.   Review of Systems:   Pertinent items are noted in HPI Denies abnormal vaginal discharge w/ itching/odor/irritation, headaches, visual changes, shortness of breath, chest pain, abdominal pain, severe nausea/vomiting, or problems with urination or bowel movements unless otherwise stated above.  Pertinent History Reviewed:  Reviewed past medical,surgical, social, obstetrical and family history.  Reviewed problem list, medications and allergies.  Physical Assessment:  There were no vitals filed for this visit.There is no height or weight on file to calculate BMI.           Physical Examination:   General appearance: alert, well appearing, and in no distress, oriented to person, place, and time and overweight  Mental status: alert, oriented to person, place, and time  Skin: warm & dry    Extremities:      Cardiovascular: normal heart rate noted  Respiratory: normal respiratory effort, no distress  Abdomen: gravid, soft, non-tender  Pelvic: Cervical exam performed       Cervix is 1.5 cm long -2 vertex confirmed  Fetal Status:        reactive NST. Elevated EFW  Fetal Surveillance Testing todayUS 37+5 wks,cephalic,BPP 8/8,AFI 15.6 cm,fhr 154 bpm,posterior placenta gr 3   A copy of this report including all images has been saved and backed up to a second source for retrieval if needed. All measures and details of the anatomical scan, placentation, fluid volume and pelvic anatomy are contained in that report.  Kristy Fowler 03/02/2020 9:36 AM :    Chaperone: Marchelle Folks Rash    No results found for this or any previous visit (from the past 24 hour(s)).   Assessment & Plan:  1) High-risk pregnancy [id number] at [redacted]w[redacted]d with an Estimated Date of Delivery: 03/18/20   2) GDMA2, on 2 po mdeds, stable., stable 3) external hemorrhoids, prescribed Anusol HC suppositories and ProctoCream  3) IOL scheduled for , 39 wk and orders to be includ  Meds: No orders of the defined types were placed in this encounter.   Labs/procedures today: BPP 8/8  Treatment Plan:  [place] 37+5 wks,cephalic,BPP 8/8,AFI 15.6 cm,fhr  154 bpm,posterior placenta gr 3      A copy of this report including all images has been saved and backed up to a second source for retrieval if needed. All measures and details of the anatomical scan, placentation, fluid volume and pelvic anatomy are contained in that report.  Kristy Fowler 03/02/2020 9:36 AM   Reviewed: Term labor symptoms and general obstetric precautions including but not limited to vaginal bleeding, contractions, leaking of fluid and fetal movement were reviewed in detail with the patient.  All questions were answered. Check bp weekly, let us know if >140/90.   Follow-up: 1 wk BPP, then IOL at 39 wk scheduled for 39/.0 /    By signing my name  below, I, Kristy Fowler, attest that this documentation has been prepared under the direction and in the presence of Tilda Burrow, MD. Electronically Signed: Mal Misty Medical Scribe. 03/01/20. 10:47 PM.  I personally performed the services described in this documentation, which was SCRIBED in my presence. The recorded information has been reviewed and considered accurate. It has been edited as necessary during review. Tilda Burrow, MD

## 2020-03-02 ENCOUNTER — Ambulatory Visit (INDEPENDENT_AMBULATORY_CARE_PROVIDER_SITE_OTHER): Payer: Medicaid Other | Admitting: Obstetrics and Gynecology

## 2020-03-02 ENCOUNTER — Other Ambulatory Visit: Payer: Self-pay | Admitting: Advanced Practice Midwife

## 2020-03-02 ENCOUNTER — Other Ambulatory Visit: Payer: Self-pay | Admitting: Obstetrics and Gynecology

## 2020-03-02 ENCOUNTER — Ambulatory Visit (INDEPENDENT_AMBULATORY_CARE_PROVIDER_SITE_OTHER): Payer: Medicaid Other

## 2020-03-02 VITALS — BP 135/85 | HR 84 | Wt 234.2 lb

## 2020-03-02 DIAGNOSIS — Z1389 Encounter for screening for other disorder: Secondary | ICD-10-CM | POA: Diagnosis not present

## 2020-03-02 DIAGNOSIS — Z3A37 37 weeks gestation of pregnancy: Secondary | ICD-10-CM

## 2020-03-02 DIAGNOSIS — O24419 Gestational diabetes mellitus in pregnancy, unspecified control: Secondary | ICD-10-CM

## 2020-03-02 DIAGNOSIS — Z331 Pregnant state, incidental: Secondary | ICD-10-CM

## 2020-03-02 DIAGNOSIS — O0993 Supervision of high risk pregnancy, unspecified, third trimester: Secondary | ICD-10-CM

## 2020-03-02 LAB — POCT URINALYSIS DIPSTICK OB
Blood, UA: NEGATIVE
Glucose, UA: NEGATIVE
Ketones, UA: NEGATIVE
Leukocytes, UA: NEGATIVE
Nitrite, UA: NEGATIVE
POC,PROTEIN,UA: NEGATIVE

## 2020-03-02 MED ORDER — HYDROCORTISONE ACETATE 25 MG RE SUPP
25.0000 mg | Freq: Two times a day (BID) | RECTAL | 0 refills | Status: DC
Start: 2020-03-02 — End: 2020-03-08

## 2020-03-02 MED ORDER — HYDROCORTISONE ACE-PRAMOXINE 1-1 % EX CREA: 1.0000 "application " | TOPICAL_CREAM | Freq: Two times a day (BID) | CUTANEOUS | 0 refills | Status: DC

## 2020-03-02 NOTE — Patient Instructions (Signed)
Balloon Catheter Placement for Cervical Ripening Balloon catheter placement for cervical ripening is a procedure to help your cervix start to soften (ripen) and open (dilate). It is done to prepare your body for labor induction. During this procedure, a thin tube (catheter) is placed through your cervix. A tiny balloon attached to the catheter is inflated with water. Pressure from the balloon is what helps your cervix start to open. Cervical ripening with a balloon catheter can make labor induction shorter and easier. You may have this procedure if:  Your cervix is not ready for labor.  Your health care provider has planned labor induction.  You are not having twins or multiples.  Your baby is in the head-down position.  You do not have any other pregnancy complications that require you to be monitored in the hospital after balloon catheter placement. If your health care provider has recommended labor induction to stimulate a vaginal birth, this procedure may be started the day before induction. You will go home with the balloon in place and return to start induction in 12-24 hours. You may have this procedure and stay in the hospital so that your progress can be monitored as well. Tell a health care provider about:  Any allergies you have.  All medicines you are taking, including vitamins, herbs, eye drops, creams, and over-the-counter medicines.  Any blood disorders you have.  Any surgeries you have had.  Any medical conditions you have. What are the risks? Generally, this is a safe procedure. However, problems may occur, including:  Infection.  Bleeding.  Cramping or pain.  Difficulty passing urine.  The baby moving from the head-down position to a position with the feet or buttocks down (breech position). What happens before the procedure?  Your health care provider may check your baby's heartbeat (fetal monitoring) before the procedure.  You may be asked to empty your  bladder. What happens during the procedure?   You will be positioned on the exam table as if you were having a pelvic exam or Pap test.  Your health care provider may insert a medical instrument into your vagina (speculum) to see your cervix.  Your cervix may be cleaned with a germ-killing solution.  The catheter will be inserted through the opening of your cervix.  A balloon on the end of the catheter will be inflated with sterile water. Some catheters have two balloons, one on each side of the cervix.  Depending on the type of balloon catheter, the end of the catheter may be left free outside your cervix or taped to your leg. The procedure may vary among health care providers and hospitals. What can I expect after the procedure?  After the procedure, it is common to have: ? A feeling of pressure inside your vagina. ? Light vaginal bleeding (spotting).  You may have fetal monitoring before going home.  You may be sent home with the catheter in place and asked to return to start your induction in about 12-24 hours. Follow these instructions at home:  Take over-the-counter and prescription medicines only as told by your health care provider.  Return to your normal activities at home as told by your health care provider. Ask your health care provider what activities are safe for you. Do not leave home until you return for labor induction.  You may shower at home. Do not take baths, swim, or use a hot tub unless your health care provider approves.  As your cervix opens, your catheter and balloon may   fall out before you return for labor induction. Ask your health care provider what you should do if this happens.  Keep all follow-up visits as told by your health care provider. This is important. You will need to return to start induction as told by your health care provider. Contact your health care provider if:  You have chills or a fever.  You have constant pain or cramps (not  contractions).  You have trouble passing urine.  Your water breaks.  You have vaginal bleeding that is heavier than spotting.  You have contractions that start to last longer and come closer together (about every 5 minutes).  The balloon catheter falls out before you return to start your induction. Summary  Cervical ripening with placement of a balloon catheter is an outpatient procedure to prepare you for labor induction.  Cervical ripening with a balloon catheter helps your cervix start to open for birth.  You will be positioned on the exam table. The catheter will be inserted through the opening of your cervix. A balloon on the end of the catheter will be inflated with water.  Pressure from the balloon will cause ripening of your cervix. You will go home with the balloon in place and return to start induction in 12-24 hours.  Contact your health care provider if you have pain, fever, vaginal bleeding, or trouble passing urine. Also contact him or her if your water breaks, you start to go into labor, or your balloon catheter falls out before you return to start your induction. This information is not intended to replace advice given to you by your health care provider. Make sure you discuss any questions you have with your health care provider. Document Revised: 03/21/2018 Document Reviewed: 03/21/2018 Elsevier Patient Education  2020 [employer]   Labor Induction  Labor induction is when steps are taken to cause a pregnant woman to begin the labor process. Most women go into labor on their own between 37 weeks and 42 weeks of pregnancy. When this does not happen or when there is a medical need for labor to begin, steps may be taken to induce labor. Labor induction causes a pregnant woman's uterus to contract. It also causes the cervix to soften (ripen), open (dilate), and thin out (efface). Usually, labor is not induced before 39 weeks of pregnancy unless there is a medical reason  to do so. Your health care provider will determine if labor induction is needed. Before inducing labor, your health care provider will consider a number of factors, including:  Your medical condition and your baby's.  How many weeks along you are in your pregnancy.  How mature your baby's lungs are.  The condition of your cervix.  The position of your baby.  The size of your birth canal. What are some reasons for labor induction? Labor may be induced if:  Your health or your baby's health is at risk.  Your pregnancy is overdue by 1 week or more.  Your water breaks but labor does not start on its own.  There is a low amount of amniotic fluid around your baby. You may also choose (elect) to have labor induced at a certain time. Generally, elective labor induction is done no earlier than 39 weeks of pregnancy. What methods are used for labor induction? Methods used for labor induction include:  Prostaglandin medicine. This medicine starts contractions and causes the cervix to dilate and ripen. It can be taken by mouth (orally) or by being inserted into   the vagina (suppository).  Inserting a small, thin tube (catheter) with a balloon into the vagina and then expanding the balloon with water to dilate the cervix.  Stripping the membranes. In this method, your health care provider gently separates amniotic sac tissue from the cervix. This causes the cervix to stretch, which in turn causes the release of a hormone called progesterone. The hormone causes the uterus to contract. This procedure is often done during an office visit, after which you will be sent home to wait for contractions to begin.  Breaking the water. In this method, your health care provider uses a small instrument to make a small hole in the amniotic sac. This eventually causes the amniotic sac to break. Contractions should begin after a few hours.  Medicine to trigger or strengthen contractions. This medicine is given  through an IV that is inserted into a vein in your arm. Except for membrane stripping, which can be done in a clinic, labor induction is done in the hospital so that you and your baby can be carefully monitored. How long does it take for labor to be induced? The length of time it takes to induce labor depends on how ready your body is for labor. Some inductions can take up to 2-3 days, while others may take less than a day. Induction may take longer if:  You are induced early in your pregnancy.  It is your first pregnancy.  Your cervix is not ready. What are some risks associated with labor induction? Some risks associated with labor induction include:  Changes in fetal heart rate, such as being too high, too low, or irregular (erratic).  Failed induction.  Infection in the mother or the baby.  Increased risk of having a cesarean delivery.  Fetal death.  Breaking off (abruption) of the placenta from the uterus (rare).  Rupture of the uterus (very rare). When induction is needed for medical reasons, the benefits of induction generally outweigh the risks. What are some reasons for not inducing labor? Labor induction should not be done if:  Your baby does not tolerate contractions.  You have had previous surgeries on your uterus, such as a myomectomy, removal of fibroids, or a vertical scar from a previous cesarean delivery.  Your placenta lies very low in your uterus and blocks the opening of the cervix (placenta previa).  Your baby is not in a head-down position.  The umbilical cord drops down into the birth canal in front of the baby.  There are unusual circumstances, such as the baby being very early (premature).  You have had more than 2 previous cesarean deliveries. Summary  Labor induction is when steps are taken to cause a pregnant woman to begin the labor process.  Labor induction causes a pregnant woman's uterus to contract. It also causes the cervix to ripen,  dilate, and efface.  Labor is not induced before 39 weeks of pregnancy unless there is a medical reason to do so.  When induction is needed for medical reasons, the benefits of induction generally outweigh the risks. This information is not intended to replace advice given to you by your health care provider. Make sure you discuss any questions you have with your health care provider. Document Revised: 07/26/2017 Document Reviewed: 09/05/2016 Elsevier Patient Education  2020 Elsevier Inc.  

## 2020-03-02 NOTE — Progress Notes (Signed)
[place] 37+5 wks,cephalic,BPP 8/8,AFI 15.6 cm,fhr 154 bpm,posterior placenta gr 3

## 2020-03-03 ENCOUNTER — Telehealth (HOSPITAL_COMMUNITY): Payer: Self-pay | Admitting: *Deleted

## 2020-03-03 DIAGNOSIS — F419 Anxiety disorder, unspecified: Secondary | ICD-10-CM | POA: Diagnosis not present

## 2020-03-03 NOTE — Telephone Encounter (Signed)
Preadmission screen  

## 2020-03-04 ENCOUNTER — Other Ambulatory Visit: Payer: Self-pay | Admitting: Advanced Practice Midwife

## 2020-03-06 ENCOUNTER — Inpatient Hospital Stay (HOSPITAL_COMMUNITY): Payer: Medicaid Other | Admitting: Anesthesiology

## 2020-03-06 ENCOUNTER — Other Ambulatory Visit: Payer: Self-pay

## 2020-03-06 ENCOUNTER — Encounter (HOSPITAL_COMMUNITY): Payer: Self-pay | Admitting: Obstetrics & Gynecology

## 2020-03-06 ENCOUNTER — Inpatient Hospital Stay (HOSPITAL_COMMUNITY)
Admission: AD | Admit: 2020-03-06 | Discharge: 2020-03-08 | DRG: 798 | Disposition: A | Discharge status: Home / Self Care | Payer: Medicaid Other | Attending: Obstetrics & Gynecology | Admitting: Obstetrics & Gynecology

## 2020-03-06 DIAGNOSIS — O139 Gestational [pregnancy-induced] hypertension without significant proteinuria, unspecified trimester: Secondary | ICD-10-CM

## 2020-03-06 DIAGNOSIS — Z3A38 38 weeks gestation of pregnancy: Secondary | ICD-10-CM | POA: Diagnosis not present

## 2020-03-06 DIAGNOSIS — O24919 Unspecified diabetes mellitus in pregnancy, unspecified trimester: Secondary | ICD-10-CM | POA: Diagnosis present

## 2020-03-06 DIAGNOSIS — O134 Gestational [pregnancy-induced] hypertension without significant proteinuria, complicating childbirth: Secondary | ICD-10-CM | POA: Diagnosis present

## 2020-03-06 DIAGNOSIS — O0990 Supervision of high risk pregnancy, unspecified, unspecified trimester: Secondary | ICD-10-CM

## 2020-03-06 DIAGNOSIS — O24425 Gestational diabetes mellitus in childbirth, controlled by oral hypoglycemic drugs: Secondary | ICD-10-CM | POA: Diagnosis not present

## 2020-03-06 DIAGNOSIS — F418 Other specified anxiety disorders: Secondary | ICD-10-CM | POA: Diagnosis not present

## 2020-03-06 DIAGNOSIS — Z9851 Tubal ligation status: Secondary | ICD-10-CM

## 2020-03-06 DIAGNOSIS — O2341 Unspecified infection of urinary tract in pregnancy, first trimester: Secondary | ICD-10-CM

## 2020-03-06 DIAGNOSIS — Z20822 Contact with and (suspected) exposure to covid-19: Secondary | ICD-10-CM | POA: Diagnosis not present

## 2020-03-06 DIAGNOSIS — Z8759 Personal history of other complications of pregnancy, childbirth and the puerperium: Secondary | ICD-10-CM

## 2020-03-06 DIAGNOSIS — O26893 Other specified pregnancy related conditions, third trimester: Secondary | ICD-10-CM | POA: Diagnosis not present

## 2020-03-06 DIAGNOSIS — Z302 Encounter for sterilization: Secondary | ICD-10-CM

## 2020-03-06 DIAGNOSIS — O99344 Other mental disorders complicating childbirth: Secondary | ICD-10-CM | POA: Diagnosis present

## 2020-03-06 DIAGNOSIS — O3663X0 Maternal care for excessive fetal growth, third trimester, not applicable or unspecified: Secondary | ICD-10-CM | POA: Diagnosis not present

## 2020-03-06 LAB — PROTEIN / CREATININE RATIO, URINE
Creatinine, Urine: 159.66 mg/dL
Protein Creatinine Ratio: 0.1 mg/mg{Cre} (ref 0.00–0.15)
Total Protein, Urine: 16 mg/dL

## 2020-03-06 LAB — COMPREHENSIVE METABOLIC PANEL
ALT: 10 U/L (ref 0–44)
AST: 17 U/L (ref 15–41)
Albumin: 2.7 g/dL — ABNORMAL LOW (ref 3.5–5.0)
Alkaline Phosphatase: 145 U/L — ABNORMAL HIGH (ref 38–126)
Anion gap: 11 (ref 5–15)
BUN: 10 mg/dL (ref 6–20)
CO2: 18 mmol/L — ABNORMAL LOW (ref 22–32)
Calcium: 9 mg/dL (ref 8.9–10.3)
Chloride: 108 mmol/L (ref 98–111)
Creatinine, Ser: 0.69 mg/dL (ref 0.44–1.00)
GFR calc Af Amer: >60 mL/min (ref >60)
GFR calc non Af Amer: >60 mL/min (ref >60)
Glucose, Bld: 97 mg/dL (ref 70–99)
Potassium: 3.9 mmol/L (ref 3.5–5.1)
Sodium: 137 mmol/L (ref 135–145)
Total Bilirubin: 0.5 mg/dL (ref 0.3–1.2)
Total Protein: 6.3 g/dL — ABNORMAL LOW (ref 6.5–8.1)

## 2020-03-06 LAB — GLUCOSE, CAPILLARY: Glucose-Capillary: 90 mg/dL (ref 70–99)

## 2020-03-06 LAB — CBC
HCT: 32.4 % — ABNORMAL LOW (ref 36.0–46.0)
Hemoglobin: 10.3 g/dL — ABNORMAL LOW (ref 12.0–15.0)
MCH: 27.1 pg (ref 26.0–34.0)
MCHC: 31.8 g/dL (ref 30.0–36.0)
MCV: 85.3 fL (ref 80.0–100.0)
Platelets: 276 10*3/uL (ref 150–400)
RBC: 3.8 MIL/uL — ABNORMAL LOW (ref 3.87–5.11)
RDW: 13.4 % (ref 11.5–15.5)
WBC: 6.3 10*3/uL (ref 4.0–10.5)
nRBC: 0 % (ref 0.0–0.2)

## 2020-03-06 LAB — TYPE AND SCREEN
ABO/RH(D): O POS
Antibody Screen: NEGATIVE

## 2020-03-06 LAB — SARS CORONAVIRUS 2 BY RT PCR (HOSPITAL ORDER, PERFORMED IN ~~LOC~~ HOSPITAL LAB): SARS Coronavirus 2: NEGATIVE

## 2020-03-06 MED ORDER — LACTATED RINGERS IV SOLN: INTRAVENOUS | Status: DC

## 2020-03-06 MED ORDER — ONDANSETRON HCL 4 MG/2ML IJ SOLN: 4.0000 mg | Freq: Four times a day (QID) | INTRAMUSCULAR | Status: DC | PRN

## 2020-03-06 MED ORDER — OXYCODONE-ACETAMINOPHEN 5-325 MG PO TABS: 2.0000 | ORAL_TABLET | ORAL | Status: DC | PRN

## 2020-03-06 MED ORDER — FENTANYL CITRATE (PF) 100 MCG/2ML IJ SOLN
100.0000 ug | INTRAMUSCULAR | Status: DC | PRN
  Administered 2020-03-06 (×2): 100 ug via INTRAVENOUS
  Filled 2020-03-06 (×3): qty 2

## 2020-03-06 MED ORDER — ACETAMINOPHEN 325 MG PO TABS: 650.0000 mg | ORAL_TABLET | ORAL | Status: DC | PRN

## 2020-03-06 MED ORDER — OXYTOCIN-SODIUM CHLORIDE 30-0.9 UT/500ML-% IV SOLN
2.5000 [IU]/h | INTRAVENOUS | Status: DC
  Filled 2020-03-06: qty 500

## 2020-03-06 MED ORDER — OXYTOCIN BOLUS FROM INFUSION
333.0000 mL | Freq: Once | INTRAVENOUS | Status: AC
  Administered 2020-03-06: 333 mL via INTRAVENOUS

## 2020-03-06 MED ORDER — SOD CITRATE-CITRIC ACID 500-334 MG/5ML PO SOLN: 30.0000 mL | ORAL | Status: DC | PRN

## 2020-03-06 MED ORDER — PHENYLEPHRINE 40 MCG/ML (10ML) SYRINGE FOR IV PUSH (FOR BLOOD PRESSURE SUPPORT): 80.0000 ug | PREFILLED_SYRINGE | INTRAVENOUS | Status: DC | PRN

## 2020-03-06 MED ORDER — MISOPROSTOL 50MCG HALF TABLET
50.0000 ug | ORAL_TABLET | ORAL | Status: DC | PRN
  Administered 2020-03-06: 50 ug via BUCCAL
  Filled 2020-03-06: qty 1

## 2020-03-06 MED ORDER — OXYCODONE-ACETAMINOPHEN 5-325 MG PO TABS: 1.0000 | ORAL_TABLET | ORAL | Status: DC | PRN

## 2020-03-06 MED ORDER — FENTANYL-BUPIVACAINE-NACL 0.5-0.125-0.9 MG/250ML-% EP SOLN
EPIDURAL | Status: AC
  Filled 2020-03-06: qty 250

## 2020-03-06 MED ORDER — LIDOCAINE HCL (PF) 1 % IJ SOLN: 30.0000 mL | INTRAMUSCULAR | Status: DC | PRN

## 2020-03-06 MED ORDER — SERTRALINE HCL 25 MG PO TABS
25.0000 mg | ORAL_TABLET | Freq: Every day | ORAL | Status: DC
  Administered 2020-03-06: 25 mg via ORAL
  Filled 2020-03-06: qty 1

## 2020-03-06 MED ORDER — DIPHENHYDRAMINE HCL 50 MG/ML IJ SOLN: 12.5000 mg | INTRAMUSCULAR | Status: DC | PRN

## 2020-03-06 MED ORDER — MISOPROSTOL 25 MCG QUARTER TABLET: 25.0000 ug | ORAL_TABLET | ORAL | Status: DC | PRN

## 2020-03-06 MED ORDER — LACTATED RINGERS IV SOLN
500.0000 mL | Freq: Once | INTRAVENOUS | Status: AC
  Administered 2020-03-06: 500 mL via INTRAVENOUS

## 2020-03-06 MED ORDER — TERBUTALINE SULFATE 1 MG/ML IJ SOLN: 0.2500 mg | Freq: Once | INTRAMUSCULAR | Status: DC | PRN

## 2020-03-06 MED ORDER — FENTANYL-BUPIVACAINE-NACL 0.5-0.125-0.9 MG/250ML-% EP SOLN: 12.0000 mL/h | EPIDURAL | Status: DC | PRN

## 2020-03-06 MED ORDER — LACTATED RINGERS IV SOLN: 500.0000 mL | INTRAVENOUS | Status: DC | PRN

## 2020-03-06 MED ORDER — EPHEDRINE 5 MG/ML INJ: 10.0000 mg | INTRAVENOUS | Status: DC | PRN

## 2020-03-06 NOTE — Anesthesia Preprocedure Evaluation (Signed)
Anesthesia Evaluation  Patient identified by MRN, date of birth, ID band Patient awake    Reviewed: Allergy & Precautions, Patient's Chart, lab work & pertinent test results  Airway Mallampati: II  TM Distance: >3 FB Neck ROM: Full    Dental no notable dental hx.    Pulmonary neg pulmonary ROS,    Pulmonary exam normal breath sounds clear to auscultation       Cardiovascular hypertension, Normal cardiovascular exam Rhythm:Regular Rate:Normal     Neuro/Psych PSYCHIATRIC DISORDERS Anxiety Depression negative neurological ROS     GI/Hepatic negative GI ROS, Neg liver ROS,   Endo/Other  diabetes, Gestational, Oral Hypoglycemic AgentsMorbid obesityBMI 40  Renal/GU negative Renal ROS  negative genitourinary   Musculoskeletal  (+) Fibromyalgia -  Abdominal (+) + obese,   Peds negative pediatric ROS (+)  Hematology negative hematology ROS (+)   Anesthesia Other Findings   Reproductive/Obstetrics (+) Pregnancy G4P2, no prior epidurals                              Anesthesia Physical Anesthesia Plan  ASA: III and emergent  Anesthesia Plan: Epidural   Post-op Pain Management:    Induction:   PONV Risk Score and Plan: 2  Airway Management Planned: Natural Airway  Additional Equipment: None  Intra-op Plan:   Post-operative Plan:   Informed Consent: I have reviewed the patients History and Physical, chart, labs and discussed the procedure including the risks, benefits and alternatives for the proposed anesthesia with the patient or authorized representative who has indicated his/her understanding and acceptance.       Plan Discussed with:   Anesthesia Plan Comments:         Anesthesia Quick Evaluation

## 2020-03-06 NOTE — Discharge Summary (Signed)
Postpartum Discharge Summary    Patient Name: Kristy Fowler DOB: 1985-09-23 MRN: [id number]  Date of admission: 03/06/2020 Delivery date:03/06/2020  Delivering provider: Wende Mott  Date of discharge: 03/08/2020  Admitting diagnosis: Diabetes mellitus in pregnancy [O24.919] Intrauterine pregnancy: [redacted]w[redacted]d    Secondary diagnosis:  Active Problems:   Diabetes mellitus in pregnancy   Shoulder dystocia, delivered   Gestational hypertension  Additional problems: n/a    Discharge diagnosis: Term Pregnancy Delivered, VBAC and GDM A2                                              Post partum procedures:N/A Augmentation: Cytotec Complications: None  Hospital course: Onset of Labor With Vaginal Delivery      34y.o. yo [id number] 33w2das admitted in Latent Labor on 03/06/2020. Patient had an uncomplicated labor course as follows:  Membrane Rupture Time/Date: 9:59 PM ,03/06/2020   Delivery Method:  Episiotomy: None  Lacerations:  None  Patient had an uncomplicated postpartum course.  She is ambulating, tolerating a regular diet, passing flatus, and urinating well. Patient is discharged home in stable condition on 03/08/20.  Newborn Data: Birth date:03/06/2020  Birth time:11:12 PM  Gender:Female  Living status:Living  Apgars:8 ,9  Weight:   Magnesium Sulfate received: No BMZ received: No Rhophylac:N/A MMR:No T-DaP:Given prenatally Flu: N/A Transfusion:No  Physical exam  Vitals:   03/07/20 1713 03/07/20 2056 03/08/20 0018 03/08/20 0506  BP: 127/72 112/66 112/68 99/60  Pulse: 75 80 73 75  Resp: '20 18 18 16  ' Temp: 98.8 F (37.1 C) 98.3 F (36.8 C) 98 F (36.7 C) 98.6 F (37 C)  TempSrc: Oral Oral Oral Oral  SpO2:  98% 100% 100%  Weight:      Height:       General: alert, cooperative and no distress Lochia: appropriate Uterine Fundus: firm Incision: N/A DVT Evaluation: No evidence of DVT seen on physical exam. Labs: Lab Results  Component Value Date   WBC 6.3  03/06/2020   HGB 10.3 (L) 03/06/2020   HCT 32.4 (L) 03/06/2020   MCV 85.3 03/06/2020   PLT 276 03/06/2020   CMP Latest Ref Rng & Units 03/06/2020  Glucose 70 - 99 mg/dL 97  BUN 6 - 20 mg/dL 10  Creatinine 0.44 - 1.00 mg/dL 0.69  Sodium 135 - 145 mmol/L 137  Potassium 3.5 - 5.1 mmol/L 3.9  Chloride 98 - 111 mmol/L 108  CO2 22 - 32 mmol/L 18(L)  Calcium 8.9 - 10.3 mg/dL 9.0  Total Protein 6.5 - 8.1 g/dL 6.3(L)  Total Bilirubin 0.3 - 1.2 mg/dL 0.5  Alkaline Phos 38 - 126 U/L 145(H)  AST 15 - 41 U/L 17  ALT 0 - 44 U/L 10   Edinburgh Score: No flowsheet data found.   After visit meds:  Allergies as of 03/08/2020   No Known Allergies     Medication List    STOP taking these medications   acetaminophen 325 MG tablet Commonly known as: TYLENOL   aspirin EC 81 MG tablet   fluconazole 150 MG tablet Commonly known as: DIFLUCAN   glyBURIDE 5 MG tablet Commonly known as: DIABETA   hydrocortisone 25 MG suppository Commonly known as: ANUSOL-HC   pramoxine-hydrocortisone 1-1 % rectal cream Commonly known as: PROCTOCREAM-HC     TAKE these medications   Accu-Chek Guide test  strip Generic drug: glucose blood USE 1 STRIP TO CHECK GLUCOSE 4 TIMES DAILY   Blood Pressure Monitor Misc For regular home bp monitoring during pregnancy   ibuprofen 600 MG tablet Commonly known as: ADVIL Take 1 tablet (600 mg total) by mouth every 6 (six) hours.   metFORMIN 500 MG tablet Commonly known as: GLUCOPHAGE Take 1 tablet (500 mg total) by mouth daily with breakfast. What changed: when to take this   oxyCODONE-acetaminophen 5-325 MG tablet Commonly known as: PERCOCET/ROXICET Take 2 tablets by mouth every 6 (six) hours as needed for up to 3 days for moderate pain.   prenatal vitamin w/FE, FA 27-1 MG Tabs tablet Take 1 tablet by mouth daily at 12 noon.   sertraline 25 MG tablet Commonly known as: ZOLOFT Take 1 tablet (25 mg total) by mouth daily.        Discharge home in stable  condition Infant Feeding: both Infant Disposition:home with mother Discharge instruction: per After Visit Summary and Postpartum booklet. Activity: Advance as tolerated. Pelvic rest for 6 weeks.  Diet: carb modified diet Future Appointments: Future Appointments  Date Time Provider [place]  03/09/2020  9:00 AM [facility] [place] [facility] None  03/09/2020  9:50 AM Roma Schanz, CNM [facility]  03/10/2020  3:50 PM Cresenzo-Dishmon, Joaquim Lai, CNM [facility]  03/11/2020 12:00 AM [facility] SCHED ROOM [facility] None   Follow up Visit:   Please schedule this patient for a In person postpartum visit in 1 week with the following provider: Any provider. Additional Postpartum F/U:2 hour GTT and BP check 1 week  High risk pregnancy complicated by: GDM and HTN Delivery mode:    Anticipated Birth Control:  BTL done PP   Mallie Snooks, MSN, CNM Certified Nurse Midwife, [employer] for [employer], [place] [facility] 03/08/20 7:24 AM

## 2020-03-06 NOTE — Progress Notes (Signed)
Kristy Fowler is a 34 y.o. [id number] at [redacted]w[redacted]d admitted for IOL for GDMA2 and suspected gHTN.  Subjective: Patient is doing well currently, comfortable and agreeable to induction measures.   Objective: BP (!) 139/87   Pulse 78   Temp 98.5 F (36.9 C) (Oral)   Resp 18   Ht 5\' 4"  (1.626 m)   Wt (!) 106 kg   LMP 06/12/2019 (Exact Date)   SpO2 99%   BMI 40.10 kg/m  No intake/output data recorded.  FHT:  FHR: 150 bpm, variability: moderate,  accelerations:  Present,  decelerations:  Present occasional variables UC:   irregular, every 2-10 minutes  SVE:   Dilation: 2.5 Effacement (%): 50 Station: -2 Exam by:: Polos RN   Labs: Lab Results  Component Value Date   WBC 6.3 03/06/2020   HGB 10.3 (L) 03/06/2020   HCT 32.4 (L) 03/06/2020   MCV 85.3 03/06/2020   PLT 276 03/06/2020    Assessment / Plan: Kristy Fowler is a 34 y.o. 32 at [redacted]w[redacted]d admitted for IOL for gHTN and GDMA2.  #Labor: Buccal Cytotec to be given after [redacted]w[redacted]d at 2100. #Pain:  IV PRN, epidural available upon request #GDMA2: on metformin and glyburide. Q 4 CBGs in latent phase and Q2 hours CBGs in active labor. Last CBG 90. #elevated BP: No elevated Bps during the pregnancy so far but elevated in the MAU today. PreE labs were unremarkable, continue BP monitoring. On ASA #FWB: Cat II, overall reassuring. Good variability, occasional variable decels. #ID: GBS negative #MOF: Breast and Bottle #MOC:BTL #Circ: yes   2101 DO PGY1, Family Medicine Resident 03/06/2020, 8:33 PM

## 2020-03-06 NOTE — MAU Note (Signed)
Kristy Fowler is a 34 y.o. at [redacted]w[redacted]d here in MAU reporting: contractions since midnight, have been getting closer and more painful. Now they are about every 6-7 minutes. No LOF. Has had some bleeding in her mucus plug. +FM. Was 1.5cm last Wednesday.   Onset of complaint: today  Pain score: 8/10  Vitals:   03/06/20 1821  BP: (!) 138/91  Pulse: 88  Resp: 16  Temp: 98.5 F (36.9 C)  SpO2: 99%     FHT: +FM  Lab orders placed from triage: none

## 2020-03-06 NOTE — H&P (Addendum)
OBSTETRIC ADMISSION HISTORY AND PHYSICAL  Kristy Fowler is a 34 y.o. female [phone] with IUP at [redacted]w[redacted]d by LMP and 8 wk [place] presenting for SOL. She reports +FMs, No LOF, no VB, no blurry vision or peripheral edema, and RUQ pain.  She notes mild headaches but similar to previous headaches.  She plans on breast and bottle feeding. She request BTL for birth control. She received her prenatal care at [facility]   Dating: By LMP and 8 wk [place] --->  Estimated Date of Delivery: 03/18/20  Sono:  @[redacted]w[redacted]d , CWD, normal anatomy, cephalix presentation, 3665g, 96% EFW  Prenatal History/Complications: Hx of gHTN on ASA gDMA2 on metformin and glyburide Marginal placenta previa, later resolved on LGA, most recently baby in 96th percentile  Past Medical History: Past Medical History:  Diagnosis Date   Anxiety    BV (bacterial vaginosis) 08/24/2019   +BV on CV swab, 08/24/19 rx flagyl    Carpal tunnel syndrome of right wrist    Depression    Diabetes mellitus without complication (HCC)    Fibromyalgia    Wears glasses     Past Surgical History: Past Surgical History:  Procedure Laterality Date   RECONSTRUCTION SURGERY'S FOR LEFT CLUB FOOT  x8  total , from child to last one 2012   TRIGGER FINGER RELEASE Right 02/17/2015   Procedure: RIGHT CARPAL TUNNEL RELEASE;  Surgeon: 02/19/2015, MD;  Location: [facility];  Service: Orthopedics;  Laterality: Right;    Obstetrical History: OB History     Gravida  4   Para  2   Term  2   Preterm      AB  1   Living  2      SAB      TAB  1   Ectopic      Multiple      Live Births  2           Social History: Social History   Socioeconomic History   Marital status: Married    Spouse name: [facility]   Number of children: Not on file   Years of education: Not on file   Highest education level: GED or equivalent  Occupational History   Not on file  Tobacco Use   Smoking status: Never Smoker   Smokeless  tobacco: Never Used  Vaping Use   Vaping Use: Never used  Substance and Sexual Activity   Alcohol use: No   Drug use: No   Sexual activity: Yes    Birth control/protection: None  Other Topics Concern   Not on file  Social History Narrative   Not on file   Social Determinants of Health   Financial Resource Strain: Low Risk    Difficulty of Paying Living Expenses: Not hard at all  Food Insecurity: No Food Insecurity   Worried About Kristy Fowler in the Last Year: Never true   Ran Out of Food in the Last Year: Never true  Transportation Needs: No Transportation Needs   Lack of Transportation (Medical): No   Lack of Transportation (Non-Medical): No  Physical Activity: Insufficiently Active   Days of Exercise per Week: 2 days   Minutes of Exercise per Session: 30 min  Stress: Stress Concern Present   Feeling of Stress : To some extent  Social Connections: Moderately Isolated   Frequency of Communication with Friends and Family: More than three times a week   Frequency of Social Gatherings with Friends and  Family: Twice a week   Attends Religious Services: Never   Database administrator or Organizations: No   Attends Engineer, structural: Never   Marital Status: Married    Family History: Family History  Problem Relation Age of Onset   Hypertension Maternal Grandmother    Hypertension Maternal Grandfather    Stroke Maternal Grandfather    Heart disease Maternal Grandfather    Cancer Maternal Aunt        maternal great aunt   Hypertension Mother    Diabetes Mother     Allergies: No Known Allergies  Medications Prior to Admission  Medication Sig Dispense Refill Last Dose   ACCU-CHEK GUIDE test strip USE 1 STRIP TO CHECK GLUCOSE 4 TIMES DAILY      acetaminophen (TYLENOL) 325 MG tablet Take 650 mg by mouth every 6 (six) hours as needed.      aspirin EC 81 MG tablet Take 81 mg by mouth daily.      Blood Pressure Monitor MISC For regular home bp monitoring  during pregnancy 1 each 0    fluconazole (DIFLUCAN) 150 MG tablet Take 1 tablet (150 mg total) by mouth every 3 (three) days. 2 tablet 1    glyBURIDE (DIABETA) 5 MG tablet Take 1 tablet (5 mg total) by mouth at bedtime. 30 tablet 3    hydrocortisone (ANUSOL-HC) 25 MG suppository Place 1 suppository (25 mg total) rectally 2 (two) times daily. 12 suppository 0    metFORMIN (GLUCOPHAGE) 500 MG tablet Take 1 tablet (500 mg total) by mouth 2 (two) times daily with a meal. (Patient taking differently: Take 1,000 mg by mouth 2 (two) times daily with a meal. ) 60 tablet 6    pramoxine-hydrocortisone (PROCTOCREAM-HC) 1-1 % rectal cream Place 1 application rectally 2 (two) times daily. 30 g 0    prenatal vitamin w/FE, FA (PRENATAL 1 + 1) 27-1 MG TABS tablet Take 1 tablet by mouth daily at 12 noon. 30 tablet 12    sertraline (ZOLOFT) 25 MG tablet Take 1 tablet (25 mg total) by mouth daily. 30 tablet 6     Review of Systems   All systems reviewed and negative except as stated in HPI  Blood pressure 126/81, pulse 87, temperature 98.5 F (36.9 C), temperature source Oral, resp. rate 16, height 5\' 4"  (1.626 m), weight (!) 106 kg, last menstrual period 06/12/2019, SpO2 99 %. General appearance: alert, cooperative and appears stated age Lungs: clear to auscultation bilaterally Heart: regular rate and rhythm Abdomen: soft, non-tender; bowel sounds normal Psych: flat affect, appears somewhat guarded Presentation: cephalic Fetal monitoringBaseline: 150 bpm, Variability: Good {> 6 bpm), Accelerations: Reactive and Decelerations: Variable: mild Uterine activityFrequency: Every 10 minutes Dilation: 2.5 Effacement (%): 50 Station: -2 Exam by:: Polos RN  Prenatal labs: ABO, Rh: O/Positive/-- (02/03 1035) Antibody: Negative (05/20 0848) Rubella: 6.33 (02/03 1035) RPR: Non Reactive (05/20 0848)  HBsAg: Negative (02/03 1035)  HIV: Non Reactive (05/20 0848)  GBS: Negative/-- (07/21 1330)  1 hr Glucola  abnormal, GDMA2 Genetic screening  normal Anatomy 08-18-2005: Normal, LGA 96% most recently  Prenatal Transfer Tool  Maternal Diabetes: Yes:  Diabetes Type:  Insulin/Medication controlled Genetic Screening: Normal Maternal Ultrasounds/Referrals: Other:LGA Fetal Ultrasounds or other Referrals:  None Maternal Substance Abuse:  No Significant Maternal Medications:  Meds include: Zoloft Other: ASA Significant Maternal Lab Results: Group B Strep negative  No results found for this or any previous visit (from the past 24 hour(s)).  Patient Active Problem List  Diagnosis Date Noted   Depression with anxiety 12/28/2019   UTI (urinary tract infection) during pregnancy, first trimester 09/11/2019   Supervision of high risk pregnancy, antepartum 09/09/2019   History of gestational hypertension 09/09/2019   Diabetes mellitus without complication (HCC)     Assessment/Plan:  Kristy Fowler is a 34 y.o. [phone] at [redacted]w[redacted]d who is being admitted for IOL secondary to GDM and likely gHTN given mild range blood pressures in MAU (HELLP labs wnl on admission). In addition, several variable decelerations visible on initial fetal heart monitoring.  #Labor: Will plan for buccal cytotec + FB given initial cervical exam on arrival. #Pain: IV PRN per pt request #GDMA2: on metformin and glyburide in pregnancy. Q 4 CBGs in latent phase and Q2 hours CBGs in active labor. #gHTN vs Preeclampsia: Prior blood pressures all wnl at prior prenatal appointment but several mild range blood pressures on arrival to MAU today. No concerning symptoms and HELLP labs on admission wnl as noted above. CTM.  #FWB: Cat II strip given intermittent variable decelerations; overall reassuring given good variability. #ID: negative #MOF: Breast and Bottle #MOC: BTL #Circ: yes  Mirian Mo, MD  03/06/2020, 6:56 PM   Attestation of Supervision of Student:  I confirm that I have verified the information documented in the  resident's note  and that I have also personally performed the history, physical exam and all medical decision making activities.  I have verified that all services and findings are accurately documented in this student's note; and I agree with management and plan as outlined in the documentation. I have also made any necessary editorial changes.  Sheila Oats, MD Center for [facility], [facility] 03/06/2020 8:46 PM

## 2020-03-06 NOTE — Progress Notes (Signed)
Kristy Fowler is a 34 y.o. [id number] at [redacted]w[redacted]d admitted for induction of labor due to [facility] and gHTN.  Subjective: Patient very uncomfortable with painful contractions.  Objective: BP (!) 135/76   Pulse 76   Temp 98.7 F (37.1 C) (Oral)   Resp 18   Ht 5\' 4"  (1.626 m)   Wt (!) 105.7 kg   LMP 06/12/2019 (Exact Date)   SpO2 99%   BMI 39.99 kg/m  No intake/output data recorded.  FHT:  FHR: 135 bpm, variability: moderate,  accelerations:  Abscent,  decelerations:  Present variables UC:   irregular, every 1-5 minutes  SVE:   Dilation: 6 Effacement (%): 100 Station: 0 Exam by:: [email or web address] CNM   Labs: Lab Results  Component Value Date   WBC 6.3 03/06/2020   HGB 10.3 (L) 03/06/2020   HCT 32.4 (L) 03/06/2020   MCV 85.3 03/06/2020   PLT 276 03/06/2020    Assessment / Plan: Kristy Fowler is a 34 y.o. 32 at [redacted]w[redacted]d admitted for IOL for gHTN and GDMA2  #Labor: Buccal Cytotec x1 2107. SROM at 2159. Patient progressed to 6/100/0. Expectant management of vaginal delivery at this time. Pitocin PRN. #Pain:IV PRN, planning epidural #GDMA2: on metformin and glyburide. Q 4 CBGs in latent phase and Q2 hours CBGs in active labor. Last CBG 90. #elevated BP: No elevated Bps prior to admission, but elevated in the MAU today. PreE labs were unremarkable, continue BP monitoring. On ASA #FWB:Cat II, overall reassuring. Good variability, occasional variable decels. #ID: GBS negative #MOF:Breast and Bottle #MOC:BTL #Circ: yes  2160 DO PGY1, Family Medicine Resident 03/06/2020, 10:35 PM

## 2020-03-06 NOTE — MAU Note (Signed)
Pt c/o ctx starting about midnight last night, and are now stronger.  SVD x2, GDM and GHTN this pregnancy, baby is LGA, no other problems reported.

## 2020-03-07 ENCOUNTER — Encounter (HOSPITAL_COMMUNITY)
Admission: AD | Disposition: A | Discharge status: Home / Self Care | Payer: Self-pay | Source: Home / Self Care | Attending: Obstetrics & Gynecology

## 2020-03-07 ENCOUNTER — Encounter (HOSPITAL_COMMUNITY): Payer: Self-pay | Admitting: Obstetrics & Gynecology

## 2020-03-07 ENCOUNTER — Inpatient Hospital Stay (HOSPITAL_COMMUNITY): Payer: Medicaid Other | Admitting: Certified Registered"

## 2020-03-07 DIAGNOSIS — Z302 Encounter for sterilization: Secondary | ICD-10-CM

## 2020-03-07 DIAGNOSIS — Z9851 Tubal ligation status: Secondary | ICD-10-CM

## 2020-03-07 HISTORY — PX: TUBAL LIGATION: SHX77

## 2020-03-07 LAB — RPR: RPR Ser Ql: NONREACTIVE

## 2020-03-07 SURGERY — LIGATION, FALLOPIAN TUBE, POSTPARTUM
Anesthesia: Epidural | Wound class: Clean Contaminated

## 2020-03-07 MED ORDER — IBUPROFEN 600 MG PO TABS
600.0000 mg | ORAL_TABLET | Freq: Four times a day (QID) | ORAL | Status: DC
  Administered 2020-03-07 – 2020-03-08 (×4): 600 mg via ORAL
  Filled 2020-03-07 (×4): qty 1

## 2020-03-07 MED ORDER — OXYCODONE HCL 5 MG PO TABS: 10.0000 mg | ORAL_TABLET | ORAL | Status: DC | PRN

## 2020-03-07 MED ORDER — FAMOTIDINE 20 MG PO TABS
40.0000 mg | ORAL_TABLET | Freq: Once | ORAL | Status: AC
  Administered 2020-03-07: 40 mg via ORAL
  Filled 2020-03-07: qty 2

## 2020-03-07 MED ORDER — OXYCODONE-ACETAMINOPHEN 5-325 MG PO TABS
2.0000 | ORAL_TABLET | Freq: Four times a day (QID) | ORAL | Status: DC | PRN
  Administered 2020-03-07: 2 via ORAL
  Filled 2020-03-07: qty 2

## 2020-03-07 MED ORDER — BENZOCAINE-MENTHOL 20-0.5 % EX AERO: 1.0000 "application " | INHALATION_SPRAY | CUTANEOUS | Status: DC | PRN

## 2020-03-07 MED ORDER — ONDANSETRON HCL 4 MG/2ML IJ SOLN: 4.0000 mg | INTRAMUSCULAR | Status: DC | PRN

## 2020-03-07 MED ORDER — COCONUT OIL OIL: 1.0000 "application " | TOPICAL_OIL | Status: DC | PRN

## 2020-03-07 MED ORDER — DIPHENHYDRAMINE HCL 25 MG PO CAPS: 25.0000 mg | ORAL_CAPSULE | Freq: Four times a day (QID) | ORAL | Status: DC | PRN

## 2020-03-07 MED ORDER — SIMETHICONE 80 MG PO CHEW: 80.0000 mg | CHEWABLE_TABLET | ORAL | Status: DC | PRN

## 2020-03-07 MED ORDER — KETOROLAC TROMETHAMINE 30 MG/ML IJ SOLN
INTRAMUSCULAR | Status: AC
  Filled 2020-03-07: qty 1

## 2020-03-07 MED ORDER — DEXAMETHASONE SODIUM PHOSPHATE 10 MG/ML IJ SOLN
INTRAMUSCULAR | Status: DC | PRN
Start: 2020-03-07 — End: 2020-03-07
  Administered 2020-03-07: 5 mg via INTRAVENOUS

## 2020-03-07 MED ORDER — PROMETHAZINE HCL 25 MG/ML IJ SOLN: 6.2500 mg | INTRAMUSCULAR | Status: DC | PRN

## 2020-03-07 MED ORDER — LACTATED RINGERS IV SOLN
INTRAVENOUS | Status: DC | PRN
Start: 2020-03-07 — End: 2020-03-07

## 2020-03-07 MED ORDER — OXYCODONE HCL 5 MG PO TABS: 5.0000 mg | ORAL_TABLET | ORAL | Status: DC | PRN

## 2020-03-07 MED ORDER — MIDAZOLAM HCL 2 MG/2ML IJ SOLN
INTRAMUSCULAR | Status: AC
  Filled 2020-03-07: qty 2

## 2020-03-07 MED ORDER — OXYCODONE HCL 5 MG/5ML PO SOLN: 5.0000 mg | Freq: Once | ORAL | Status: DC | PRN

## 2020-03-07 MED ORDER — ONDANSETRON HCL 4 MG/2ML IJ SOLN
INTRAMUSCULAR | Status: DC | PRN
  Administered 2020-03-07: 4 mg via INTRAVENOUS

## 2020-03-07 MED ORDER — ACETAMINOPHEN 325 MG PO TABS: 650.0000 mg | ORAL_TABLET | ORAL | Status: DC | PRN

## 2020-03-07 MED ORDER — ONDANSETRON HCL 4 MG PO TABS: 4.0000 mg | ORAL_TABLET | ORAL | Status: DC | PRN

## 2020-03-07 MED ORDER — KETOROLAC TROMETHAMINE 30 MG/ML IJ SOLN
INTRAMUSCULAR | Status: DC | PRN
Start: 2020-03-07 — End: 2020-03-07
  Administered 2020-03-07: 30 mg via INTRAVENOUS

## 2020-03-07 MED ORDER — METHYLERGONOVINE MALEATE 0.2 MG/ML IJ SOLN: 0.2000 mg | INTRAMUSCULAR | Status: DC | PRN

## 2020-03-07 MED ORDER — WITCH HAZEL-GLYCERIN EX PADS: 1.0000 "application " | MEDICATED_PAD | CUTANEOUS | Status: DC | PRN

## 2020-03-07 MED ORDER — DIBUCAINE (PERIANAL) 1 % EX OINT: 1.0000 "application " | TOPICAL_OINTMENT | CUTANEOUS | Status: DC | PRN

## 2020-03-07 MED ORDER — PRENATAL MULTIVITAMIN CH: 1.0000 | ORAL_TABLET | Freq: Every day | ORAL | Status: DC

## 2020-03-07 MED ORDER — OXYCODONE HCL 5 MG PO TABS: 5.0000 mg | ORAL_TABLET | Freq: Once | ORAL | Status: DC | PRN

## 2020-03-07 MED ORDER — BUPIVACAINE HCL (PF) 0.25 % IJ SOLN
INTRAMUSCULAR | Status: DC | PRN
  Administered 2020-03-07: 30 mL

## 2020-03-07 MED ORDER — OXYTOCIN-SODIUM CHLORIDE 30-0.9 UT/500ML-% IV SOLN: 2.5000 [IU]/h | INTRAVENOUS | Status: DC | PRN

## 2020-03-07 MED ORDER — HYDROMORPHONE HCL 1 MG/ML IJ SOLN: 0.2500 mg | INTRAMUSCULAR | Status: DC | PRN

## 2020-03-07 MED ORDER — SODIUM CHLORIDE 0.9 % IR SOLN
Status: DC | PRN
  Administered 2020-03-07: 1000 mL

## 2020-03-07 MED ORDER — BUPIVACAINE HCL (PF) 0.25 % IJ SOLN
INTRAMUSCULAR | Status: AC
  Filled 2020-03-07: qty 30

## 2020-03-07 MED ORDER — SODIUM BICARBONATE 8.4 % IV SOLN
INTRAVENOUS | Status: DC | PRN
  Administered 2020-03-07 (×3): 5 mL via EPIDURAL
  Administered 2020-03-07: 2 mL via EPIDURAL
  Administered 2020-03-07: 3 mL via EPIDURAL

## 2020-03-07 MED ORDER — LACTATED RINGERS IV SOLN: INTRAVENOUS | Status: DC

## 2020-03-07 MED ORDER — FENTANYL CITRATE (PF) 100 MCG/2ML IJ SOLN
INTRAMUSCULAR | Status: DC | PRN
  Administered 2020-03-07: 50 ug via INTRAVENOUS
  Administered 2020-03-07: 25 ug via INTRAVENOUS

## 2020-03-07 MED ORDER — METOCLOPRAMIDE HCL 10 MG PO TABS
10.0000 mg | ORAL_TABLET | Freq: Once | ORAL | Status: AC
  Administered 2020-03-07: 10 mg via ORAL
  Filled 2020-03-07: qty 1

## 2020-03-07 MED ORDER — KETOROLAC TROMETHAMINE 30 MG/ML IJ SOLN: 30.0000 mg | Freq: Once | INTRAMUSCULAR | Status: DC | PRN

## 2020-03-07 MED ORDER — METHYLERGONOVINE MALEATE 0.2 MG PO TABS: 0.2000 mg | ORAL_TABLET | ORAL | Status: DC | PRN

## 2020-03-07 MED ORDER — MIDAZOLAM HCL 5 MG/5ML IJ SOLN
INTRAMUSCULAR | Status: DC | PRN
Start: 2020-03-07 — End: 2020-03-07
  Administered 2020-03-07: 1 mg via INTRAVENOUS

## 2020-03-07 MED ORDER — BUPIVACAINE HCL (PF) 0.25 % IJ SOLN
INTRAMUSCULAR | Status: DC | PRN
  Administered 2020-03-06: 1 mL via INTRATHECAL

## 2020-03-07 MED ORDER — SODIUM CHLORIDE (PF) 0.9 % IJ SOLN
INTRAMUSCULAR | Status: DC | PRN
  Administered 2020-03-06: 12 mL/h via EPIDURAL

## 2020-03-07 MED ORDER — METFORMIN HCL 500 MG PO TABS
500.0000 mg | ORAL_TABLET | Freq: Every day | ORAL | Status: DC
  Administered 2020-03-08: 500 mg via ORAL
  Filled 2020-03-07: qty 1

## 2020-03-07 MED ORDER — FENTANYL CITRATE (PF) 100 MCG/2ML IJ SOLN
INTRAMUSCULAR | Status: AC
  Filled 2020-03-07: qty 2

## 2020-03-07 MED ORDER — FENTANYL CITRATE (PF) 100 MCG/2ML IJ SOLN
INTRAMUSCULAR | Status: DC | PRN
  Administered 2020-03-06: 15 ug via INTRATHECAL

## 2020-03-07 SURGICAL SUPPLY — 21 items
BLADE SURG 11 STRL SS (BLADE) ×2 IMPLANT
DRESSING OPSITE X SMALL 2X3 (GAUZE/BANDAGES/DRESSINGS) ×1 IMPLANT
DRSG OPSITE POSTOP 3X4 (GAUZE/BANDAGES/DRESSINGS) ×2 IMPLANT
DURAPREP 26ML APPLICATOR (WOUND CARE) ×2 IMPLANT
GLOVE BIOGEL PI IND STRL 7.0 (GLOVE) ×1 IMPLANT
GLOVE BIOGEL PI IND STRL 7.5 (GLOVE) ×1 IMPLANT
GLOVE BIOGEL PI INDICATOR 7.0 (GLOVE) ×1
GLOVE BIOGEL PI INDICATOR 7.5 (GLOVE) ×1
GLOVE ECLIPSE 7.5 STRL STRAW (GLOVE) ×2 IMPLANT
GOWN STRL REUS W/TWL LRG LVL3 (GOWN DISPOSABLE) ×4 IMPLANT
HIBICLENS CHG 4% 4OZ BTL (MISCELLANEOUS) ×2 IMPLANT
NEEDLE HYPO 22GX1.5 SAFETY (NEEDLE) ×2 IMPLANT
NS IRRIG 1000ML POUR BTL (IV SOLUTION) ×2 IMPLANT
PACK ABDOMINAL MINOR (CUSTOM PROCEDURE TRAY) ×2 IMPLANT
PROTECTOR NERVE ULNAR (MISCELLANEOUS) ×2 IMPLANT
SPONGE LAP 4X18 RFD (DISPOSABLE) IMPLANT
SUT VICRYL 0 UR6 27IN ABS (SUTURE) ×2 IMPLANT
SUT VICRYL 4-0 PS2 18IN ABS (SUTURE) ×2 IMPLANT
SYR CONTROL 10ML LL (SYRINGE) ×2 IMPLANT
TOWEL OR 17X24 6PK STRL BLUE (TOWEL DISPOSABLE) ×4 IMPLANT
TRAY FOLEY W/BAG SLVR 14FR (SET/KITS/TRAYS/PACK) ×2 IMPLANT

## 2020-03-07 NOTE — Transfer of Care (Signed)
Immediate Anesthesia Transfer of Care Note  Patient: Kristy Fowler  Procedure(s) Performed: POST PARTUM TUBAL LIGATION (N/A )  Patient Location: PACU  Anesthesia Type:Epidural  Level of Consciousness: awake, alert  and oriented  Airway & Oxygen Therapy: Patient Spontanous Breathing  Post-op Assessment: Report given to RN and Post -op Vital signs reviewed and stable  Post vital signs: Reviewed and stable  Last Vitals:  Vitals Value Taken Time  BP 107/63 03/07/20 1046  Temp    Pulse 76 03/07/20 1049  Resp 15 03/07/20 1049  SpO2 98 % 03/07/20 1049  Vitals shown include unvalidated device data.  Last Pain:  Vitals:   03/07/20 0858  TempSrc: Oral  PainSc: 4          Complications: No complications documented.

## 2020-03-07 NOTE — Anesthesia Postprocedure Evaluation (Signed)
Anesthesia Post Note  Patient: Kristy Fowler  Procedure(s) Performed: POST PARTUM TUBAL LIGATION (N/A )     Patient location during evaluation: PACU Anesthesia Type: Epidural Level of consciousness: awake and alert Pain management: pain level controlled Vital Signs Assessment: post-procedure vital signs reviewed and stable Respiratory status: spontaneous breathing, nonlabored ventilation and respiratory function stable Cardiovascular status: blood pressure returned to baseline and stable Postop Assessment: no apparent nausea or vomiting Anesthetic complications: no   No complications documented.  Last Vitals:  Vitals:   03/07/20 1209 03/07/20 1307  BP: 118/75 139/88  Pulse: 81 84  Resp: 16 20  Temp: 37.1 C   SpO2: 100% 98%    Last Pain:  Vitals:   03/07/20 1307  TempSrc:   PainSc: 0-No pain   Pain Goal:                Epidural/Spinal Function Cutaneous sensation: Normal sensation (03/07/20 1307)  Lowella Curb

## 2020-03-07 NOTE — Lactation Note (Signed)
This note was copied from a baby's chart. Lactation Consultation Note  Patient Name: Kristy Fowler HERDE'Y Date: 03/07/2020    [facility] Initial Visit:  Per RN, mother does not desire a lactation consult.     Maternal Data    Feeding Feeding Type: Bottle Fed - Formula  LATCH Score                   Interventions    Lactation Tools Discussed/Used     Consult Status      Haven Foss R Tyress Loden 03/07/2020, 12:41 PM

## 2020-03-07 NOTE — Anesthesia Procedure Notes (Signed)
Epidural Patient location during procedure: OB Start time: 03/06/2020 10:40 PM End time: 03/06/2020 10:52 PM  Staffing Anesthesiologist: Lannie Fields, DO Performed: anesthesiologist   Preanesthetic Checklist Completed: patient identified, IV checked, risks and benefits discussed, monitors and equipment checked, pre-op evaluation and timeout performed  Epidural Patient position: sitting Prep: DuraPrep and site prepped and draped Patient monitoring: continuous pulse ox, blood pressure, heart rate and cardiac monitor Approach: midline Location: L3-L4 Injection technique: LOR air  Needle:  Needle type: Tuohy  Needle gauge: 17 G Needle length: 9 cm Needle insertion depth: 8 cm Catheter type: closed end flexible Catheter size: 19 Gauge Catheter at skin depth: 14 cm  Assessment Sensory level: T8 Events: blood not aspirated, injection not painful, no injection resistance, no paresthesia and negative IV test  Additional Notes CSE performed with 24G sprotteReason for block:procedure for pain

## 2020-03-07 NOTE — Progress Notes (Addendum)
POSTPARTUM PROGRESS NOTE  Subjective: Kristy Fowler is a 34 y.o. [id number] PPD#1 s/p VD at [redacted]w[redacted]d.  She reports she doing well. No acute events overnight. She denies any problems with ambulating, voiding or po intake. Denies nausea or vomiting. She has  passed flatus. Pain is well controlled.  Lochia is appropriate.  Objective: Blood pressure 111/67, pulse 77, temperature 98.7 F (37.1 C), temperature source Oral, resp. rate 18, height 5\' 4"  (1.626 m), weight (!) 105.7 kg, last menstrual period 06/12/2019, SpO2 100 %, breast and bottle feeding.  Physical Exam:  General: alert, cooperative and no distress Chest: no respiratory distress Abdomen: soft, non-tender  Uterine Fundus: firm, appropriately tender Extremities: No calf swelling or tenderness  no edema  Recent Labs    03/06/20 1858  HGB 10.3*  HCT 32.4*    Assessment/Plan: Kristy Fowler is a 34 y.o. 32 s/p VD at [redacted]w[redacted]d for GDMA2 and gHTN.  Routine Postpartum Care: Doing well, pain well-controlled.  -- Continue routine care, lactation support  -- Contraception: BTL scheduled for today at 1000. -- Feeding: Breast  Dispo: Plan for discharge tomorrow.  [redacted]w[redacted]d, DO PGY1 Family Medicine Resident  Attestation of Supervision of Student:  I confirm that I have verified the information documented in the medical student's note and that I have also personally reperformed the history, physical exam and all medical decision making activities.  I have verified that all services and findings are accurately documented in this student's note; and I agree with management and plan as outlined in the documentation. I have also made any necessary editorial changes.  Evelena Leyden, CNM Center for [facility], [facility] 03/07/2020 9:45 AM

## 2020-03-07 NOTE — Anesthesia Preprocedure Evaluation (Signed)
Anesthesia Evaluation  Patient identified by MRN, date of birth, ID band Patient awake    Reviewed: Allergy & Precautions, Patient's Chart, lab work & pertinent test results  Airway Mallampati: II  TM Distance: >3 FB Neck ROM: Full    Dental no notable dental hx.    Pulmonary neg pulmonary ROS,    Pulmonary exam normal breath sounds clear to auscultation       Cardiovascular hypertension, Normal cardiovascular exam Rhythm:Regular Rate:Normal     Neuro/Psych PSYCHIATRIC DISORDERS Anxiety Depression negative neurological ROS     GI/Hepatic negative GI ROS, Neg liver ROS,   Endo/Other  diabetes, Gestational, Oral Hypoglycemic AgentsBMI 40  Renal/GU negative Renal ROS  negative genitourinary   Musculoskeletal  (+) Fibromyalgia -  Abdominal (+) + obese,   Peds negative pediatric ROS (+)  Hematology negative hematology ROS (+)   Anesthesia Other Findings   Reproductive/Obstetrics G4P2, no prior epidurals                              Anesthesia Physical  Anesthesia Plan  ASA: III  Anesthesia Plan: Epidural   Post-op Pain Management:    Induction:   PONV Risk Score and Plan: 2  Airway Management Planned: Natural Airway  Additional Equipment: None  Intra-op Plan:   Post-operative Plan:   Informed Consent: I have reviewed the patients History and Physical, chart, labs and discussed the procedure including the risks, benefits and alternatives for the proposed anesthesia with the patient or authorized representative who has indicated his/her understanding and acceptance.       Plan Discussed with:   Anesthesia Plan Comments:         Anesthesia Quick Evaluation

## 2020-03-07 NOTE — Progress Notes (Signed)
Kristy Fowler was referred for history of depression/anxiety. * Referral screened out by Clinical Social Worker because none of the following criteria appear to apply: ~ History of anxiety/depression during this pregnancy, or of post-partum depression following prior delivery. ~ Diagnosis of anxiety and/or depression within last 3 years OR * Kristy Fowler's symptoms currently being treated with medication and/or therapy. Per chart review, Kristy Fowler is currently prescribed Zoloft.  Please contact the Clinical Social Worker if needs arise, by [facility] request, or if Kristy Fowler scores greater than 9/yes to question 10 on Edinburgh Postpartum Depression Screen.  Celso Sickle, LCSW Clinical Social Worker [facility] Cell#: [phone]

## 2020-03-07 NOTE — Op Note (Signed)
Kristy Fowler 03/07/2020  PREOPERATIVE DIAGNOSIS:  Undesired fertility  POSTOPERATIVE DIAGNOSIS:  Undesired fertility  PROCEDURE:  Postpartum Bilateral Tubal Sterilization using Filshie Clips   SURGEON:  Dr Candelaria Celeste  ANESTHESIA:  Epidural  COMPLICATIONS:  None immediate.  ESTIMATED BLOOD LOSS:  Less than 20cc.  FLUIDS: 400 mL LR.  URINE OUTPUT:  200 mL of clear urine.  INDICATIONS: 34 y.o. yo [phone]  with undesired fertility,status post vaginal delivery, desires permanent sterilization. Risks and benefits of procedure discussed with patient including permanence of method, bleeding, infection, injury to surrounding organs and need for additional procedures. Risk failure of 0.5-1% with increased risk of ectopic gestation if pregnancy occurs was also discussed with patient.   FINDINGS:  Normal uterus, tubes, and ovaries.  TECHNIQUE:  The patient was taken to the operating room where her epidural anesthesia was dosed up to surgical level and found to be adequate.  She was then placed in the dorsal supine position and prepped and draped in sterile fashion.  After an adequate timeout was performed, attention was turned to the patient's abdomen where a small transverse skin incision was made under the umbilical fold. The incision was taken down to the layer of fascia using the scalpel, and fascia was incised, and extended bilaterally using Mayo scissors. The peritoneum was entered in a sharp fashion. Attention was then turned to the patient's uterus, and left fallopian tube was identified and followed out to the fimbriated end.    The patient's left fallopian tube was then identified, and the Babcock clamp was then used to grasp the tube approximately 3 cm from the cornual region. A 3 cm segment of the tube was then doubly ligated with free tie of plain gut suture, transected and excised. Good hemostasis was noted. The right fallopian tube was then identified, doubly ligated, and a 3 cm  segment excised in a similar fashion allowing for bilateral tubal sterilization. Excellent hemostasis was noted.   Good hemostasis was noted overall.  Local analgesia was drizzled on both operative sites.The instruments were then removed from the patient's abdomen and the fascial incision was repaired with 0 Vicryl, and the skin was closed with a 3-0 Monocryl subcuticular stitch. The patient tolerated the procedure well.  Sponge, lap, and needle counts were correct times two.  The patient was then taken to the recovery room awake, extubated and in stable condition.   Levie Heritage, DO 03/07/2020 10:57 AM

## 2020-03-07 NOTE — Anesthesia Postprocedure Evaluation (Signed)
Anesthesia Post Note  Patient: Kristy Fowler  Procedure(s) Performed: AN AD HOC LABOR EPIDURAL     Patient location during evaluation: Mother Baby Anesthesia Type: Epidural Level of consciousness: awake and alert Pain management: pain level controlled Vital Signs Assessment: post-procedure vital signs reviewed and stable Respiratory status: spontaneous breathing, nonlabored ventilation and respiratory function stable Cardiovascular status: stable Postop Assessment: no headache, no backache, epidural receding, no apparent nausea or vomiting, patient able to bend at knees, adequate PO intake and able to ambulate Anesthetic complications: no   No complications documented.  Last Vitals:  Vitals:   03/07/20 0249 03/07/20 0653  BP: 111/67 (!) 131/81  Pulse: 77 71  Resp: 18 18  Temp: 37.1 C 36.8 C  SpO2: 100%     Last Pain:  Vitals:   03/07/20 0653  TempSrc: Oral  PainSc: Asleep   Pain Goal:                   Laban Emperor

## 2020-03-08 LAB — GLUCOSE, CAPILLARY
Glucose-Capillary: 87 mg/dL (ref 70–99)
Glucose-Capillary: 87 mg/dL (ref 70–99)

## 2020-03-08 LAB — SURGICAL PATHOLOGY

## 2020-03-08 MED ORDER — IBUPROFEN 600 MG PO TABS: 600.0000 mg | ORAL_TABLET | Freq: Four times a day (QID) | ORAL | 0 refills | Status: AC

## 2020-03-08 MED ORDER — METFORMIN HCL 500 MG PO TABS: 500.0000 mg | ORAL_TABLET | Freq: Every day | ORAL | 3 refills | Status: AC

## 2020-03-08 MED ORDER — OXYCODONE-ACETAMINOPHEN 5-325 MG PO TABS: 2.0000 | ORAL_TABLET | Freq: Four times a day (QID) | ORAL | 0 refills | Status: AC | PRN

## 2020-03-08 NOTE — Discharge Instructions (Signed)

## 2020-03-09 ENCOUNTER — Encounter: Payer: Medicaid Other | Admitting: Women's Health

## 2020-03-09 ENCOUNTER — Other Ambulatory Visit: Payer: Medicaid Other

## 2020-03-09 ENCOUNTER — Telehealth: Payer: Self-pay | Admitting: *Deleted

## 2020-03-09 NOTE — Telephone Encounter (Addendum)
Contacted pt to complete transition of care assessment:  Transition Care Management Follow-up Telephone Call  . Medicaid Managed Care Transition Call Status:MM [facility] Call Made  . Date of discharge and from where:[facility] 03/08/20  . How have you been since you were released from the hospital? "sleepy"  . Any questions or concerns? No  Items Reviewed: Marland Kitchen Did the pt receive and understand the discharge instructions provided? Yes  . Medications obtained and verified? Yes  . Any new allergies since your discharge? No  . Dietary orders reviewed?No. Do you have support at home?  Yes, family Functional Questionnaire: (I = Independent and D = Dependent)  ADLs: Independent Bathing/Dressing:Independent Meal Prep: Independent Eating: Independent Maintaining continence: Independent Transferring/Ambulation: Independent Managing Meds: Independent   Follow up appointments reviewed:  PCP Hospital f/u appt confirmed? No    Specialist Hospital f/u appt confirmed?  Scheduled to see Shawna Clamp on 04/12/20 at 0950; [facility] NURSE 03/14/20 at 1430 .  Are transportation arrangements needed? No   If their condition worsens, is the pt aware to call PCP or go to the EmergencyDept.? yes  Was the patient provided with contact information for the PCP's office or ED? yes  Was to pt encouraged to call back with questions or concerns?  Yes  Burnard Bunting, RN, BSN, CCRN Patient Engagement Center [phone]

## 2020-03-10 ENCOUNTER — Encounter: Payer: Medicaid Other | Admitting: Advanced Practice Midwife

## 2020-03-10 ENCOUNTER — Other Ambulatory Visit: Payer: Self-pay

## 2020-03-10 ENCOUNTER — Ambulatory Visit (INDEPENDENT_AMBULATORY_CARE_PROVIDER_SITE_OTHER): Payer: Medicaid Other | Admitting: Advanced Practice Midwife

## 2020-03-10 ENCOUNTER — Encounter: Payer: Self-pay | Admitting: Advanced Practice Midwife

## 2020-03-10 VITALS — BP 133/80 | HR 100 | Temp 99.2°F | Ht 64.0 in | Wt 221.5 lb

## 2020-03-10 DIAGNOSIS — R6883 Chills (without fever): Secondary | ICD-10-CM | POA: Diagnosis not present

## 2020-03-10 DIAGNOSIS — R519 Headache, unspecified: Secondary | ICD-10-CM

## 2020-03-10 NOTE — Progress Notes (Signed)
[facility] Visit  Patient name: Kristy Fowler MRN [id number]  Date of birth: [birth year]  CC & HPI:  Kristy Fowler is a 34 y.o.  female presenting today for chills/HA. Had SVD w/o complications on 8/1 (given dx of GHTN, but only a few mildly elevated BPs prior to delivery, all normal after delivery).  Had. BTL 8/2.  Is breastfeeding, milk not quite in yet. Having pressure in lower abdomen. HA in back of head, a little in front/sides of head.    Pertinent History Reviewed:  Medical & Surgical Hx:   Past Medical History:  Diagnosis Date  . Anxiety   . BV (bacterial vaginosis) 08/24/2019   +BV on CV swab, 08/24/19 rx flagyl   . Carpal tunnel syndrome of right wrist   . Depression   . Diabetes mellitus without complication (HCC)   . Fibromyalgia   . Wears glasses    Past Surgical History:  Procedure Laterality Date  . RECONSTRUCTION SURGERY'S FOR LEFT CLUB FOOT  x8  total , from child to last one 2012  . TRIGGER FINGER RELEASE Right 02/17/2015   Procedure: RIGHT CARPAL TUNNEL RELEASE;  Surgeon: Bradly Bienenstock, MD;  Location: [facility] [place];  Service: Orthopedics;  Laterality: Right;  . TUBAL LIGATION N/A 03/07/2020   Procedure: POST PARTUM TUBAL LIGATION;  Surgeon: Levie Heritage, DO;  Location: [facility] LD ORS;  Service: Gynecology;  Laterality: N/A;   Family History  Problem Relation Age of Onset  . Hypertension Maternal Grandmother   . Hypertension Maternal Grandfather   . Stroke Maternal Grandfather   . Heart disease Maternal Grandfather   . Cancer Maternal Aunt        maternal great aunt  . Hypertension Mother   . Diabetes Mother     Current Outpatient Medications:  .  Blood Pressure Monitor MISC, For regular home bp monitoring during pregnancy, Disp: 1 each, Rfl: 0 .  ibuprofen (ADVIL) 600 MG tablet, Take 1 tablet (600 mg total) by mouth every 6 (six) hours., Disp: 30 tablet, Rfl: 0 .  metFORMIN (GLUCOPHAGE) 500 MG tablet, Take 1 tablet (500 mg  total) by mouth daily with breakfast., Disp: 30 tablet, Rfl: 3 .  oxyCODONE-acetaminophen (PERCOCET/ROXICET) 5-325 MG tablet, Take 2 tablets by mouth every 6 (six) hours as needed for up to 3 days for moderate pain., Disp: 24 tablet, Rfl: 0 .  PROCTOFOAM HC rectal foam, Place 1 applicator rectally 2 (two) times daily. 7 day supply, Disp: , Rfl:  .  sertraline (ZOLOFT) 25 MG tablet, Take 1 tablet (25 mg total) by mouth daily., Disp: 30 tablet, Rfl: 6 .  ACCU-CHEK GUIDE test strip, USE 1 STRIP TO CHECK GLUCOSE 4 TIMES DAILY (Patient not taking: Reported on 03/10/2020), Disp: , Rfl:  .  prenatal vitamin w/FE, FA (PRENATAL 1 + 1) 27-1 MG TABS tablet, Take 1 tablet by mouth daily at 12 noon. (Patient not taking: Reported on 03/10/2020), Disp: 30 tablet, Rfl: 12 Social History: Reviewed -  reports that she has never smoked. She has never used smokeless tobacco.  Review of Systems:   Constitutional: chills intermittently Eyes: Negative for visual disturbances Respiratory: Negative for shortness of breath, dyspnea Cardiovascular: Negative for chest pain or palpitations  Gastrointestinal: Negative for vomiting, diarrhea and constipation; no abdominal pain Genitourinary: Negative for dysuria and urgency, vaginal irritation or itching Musculoskeletal: Negative for back pain, joint pain, myalgias  Neurological: Negative for dizziness  Objective Findings:    Physical Examination: Vitals:  03/10/20 1427  BP: 133/80  Pulse: 100  Temp: 99.2 F (37.3 C)   General appearance - well appearing, and in no distress Mental status - alert, oriented to person, place, and time Neck/shoulders:  + trigger point left upper trapezius  Chest:  Normal respiratory effort Heart - normal rate and regular rhythm Abdomen:  Soft, nontender.  Well healed umbilicus incision, no erythema/tenderness Pelvic: lochia rubra; no odor, + CMT.  No suspicion for endometritis Musculoskeletal:  Normal range of motion without  pain Extremities:  No edema    No results found for this or any previous visit (from the past 24 hour(s)).    Assessment & Plan:  A:   Chills/elevated temp:  Probably 2/2 breast milk coming in. No evidence of infection  Occipital HA w/+ trigger:  BPs normal. P:  Ibuprofen, massage/ice to neck/shoulders, be cognizant of head position  Keep eye on BP, let us know if elevated  F/U Monday for virtual BP check  [name] CNM 03/10/2020 2:51 PM

## 2020-03-11 ENCOUNTER — Inpatient Hospital Stay (HOSPITAL_COMMUNITY): Admission: AD | Admit: 2020-03-11 | Payer: Medicaid Other | Source: Home / Self Care | Admitting: Family Medicine

## 2020-03-11 ENCOUNTER — Inpatient Hospital Stay (HOSPITAL_COMMUNITY): Payer: Medicaid Other

## 2020-03-14 ENCOUNTER — Telehealth (INDEPENDENT_AMBULATORY_CARE_PROVIDER_SITE_OTHER): Payer: Medicaid Other | Admitting: *Deleted

## 2020-03-14 VITALS — BP 139/79 | HR 84

## 2020-03-14 DIAGNOSIS — Z0130 Encounter for examination of blood pressure without abnormal findings: Secondary | ICD-10-CM

## 2020-03-14 NOTE — Progress Notes (Signed)
   NURSE VISIT- BLOOD PRESSURE CHECK  SUBJECTIVE:  Kristy Fowler is a 34 y.o. [phone] female here for BP check. She is postpartum, delivery date 03/06/2020    HYPERTENSION ROS:  Pregnant/postpartum:  . Severe headaches that don't go away with tylenol/other medicines: No  . Visual changes (seeing spots/double/blurred vision) No  . Severe pain under right breast breast or in center of upper chest No  . Severe nausea/vomiting No  . Taking medicines as instructed not applicable   OBJECTIVE:  BP 139/79 (BP Location: Right Arm, Patient Position: Sitting, Cuff Size: Normal)   Pulse 84   LMP 06/12/2019 (Exact Date)   Breastfeeding Yes   Appearance alert, well appearing, and in no distress..  ASSESSMENT: Postpartum  blood pressure check  PLAN: Discussed with Adline Potter, NP Recommendations: no changes needed   Follow-up: one week.   Nance Pear  03/14/2020 3:11 PM

## 2020-03-14 NOTE — Progress Notes (Signed)
Chart reviewed for nurse visit. Agree with plan of care. Recheck BP in 1 week  Adline Potter, NP 03/14/2020 3:21 PM

## 2020-03-16 ENCOUNTER — Encounter: Payer: Medicaid Other | Admitting: Obstetrics and Gynecology

## 2020-03-16 ENCOUNTER — Other Ambulatory Visit: Payer: Medicaid Other

## 2020-03-17 DIAGNOSIS — F419 Anxiety disorder, unspecified: Secondary | ICD-10-CM | POA: Diagnosis not present

## 2020-03-21 ENCOUNTER — Telehealth (INDEPENDENT_AMBULATORY_CARE_PROVIDER_SITE_OTHER): Payer: Medicaid Other | Admitting: *Deleted

## 2020-03-21 ENCOUNTER — Encounter: Payer: Self-pay | Admitting: *Deleted

## 2020-03-21 VITALS — BP 133/72

## 2020-03-21 DIAGNOSIS — Z0130 Encounter for examination of blood pressure without abnormal findings: Secondary | ICD-10-CM

## 2020-03-21 NOTE — Progress Notes (Addendum)
   I connected with  Domenic Polite on 03/21/20 by a video enabled telemedicine application and verified that I am speaking with the correct person using two identifiers.   I discussed the limitations of evaluation and management by telemedicine. The patient expressed understanding and agreed to proceed.   NURSE VISIT- BLOOD PRESSURE CHECK  SUBJECTIVE:  Kristy Fowler is a 34 y.o. [phone] female here for BP check. She is postpartum, delivery date 03/07/20    HYPERTENSION ROS:  Postpartum:  . Severe headaches that don't go away with tylenol/other medicines: No  . Visual changes (seeing spots/double/blurred vision) No  . Severe pain under right breast breast or in center of upper chest No  . Severe nausea/vomiting No  . Taking medicines as instructed not applicable   OBJECTIVE:  BP 133/72 (BP Location: Right Arm, Patient Position: Sitting, Cuff Size: Normal)   Breastfeeding Yes   Appearance alert, well appearing, and in no distress and oriented to person, place, and time.  ASSESSMENT: Postpartum  blood pressure check  PLAN: Discussed with Joellyn Haff, CNM, [facility]   Recommendations: no changes needed   Follow-up: as scheduled   Jobe Marker  03/21/2020 3:55 PM   Chart reviewed for nurse visit. Agree with plan of care.  Cheral Marker, [place] 03/21/2020 4:04 PM

## 2020-03-30 DIAGNOSIS — F419 Anxiety disorder, unspecified: Secondary | ICD-10-CM | POA: Diagnosis not present

## 2020-04-12 ENCOUNTER — Encounter: Payer: Self-pay | Admitting: Women's Health

## 2020-04-12 ENCOUNTER — Other Ambulatory Visit: Payer: Self-pay

## 2020-04-12 ENCOUNTER — Telehealth (INDEPENDENT_AMBULATORY_CARE_PROVIDER_SITE_OTHER): Payer: Medicaid Other | Admitting: Women's Health

## 2020-04-12 DIAGNOSIS — K5900 Constipation, unspecified: Secondary | ICD-10-CM

## 2020-04-12 DIAGNOSIS — Z7984 Long term (current) use of oral hypoglycemic drugs: Secondary | ICD-10-CM

## 2020-04-12 DIAGNOSIS — Z9851 Tubal ligation status: Secondary | ICD-10-CM

## 2020-04-12 DIAGNOSIS — F418 Other specified anxiety disorders: Secondary | ICD-10-CM

## 2020-04-12 DIAGNOSIS — O99345 Other mental disorders complicating the puerperium: Secondary | ICD-10-CM

## 2020-04-12 DIAGNOSIS — O2413 Pre-existing diabetes mellitus, type 2, in the puerperium: Secondary | ICD-10-CM

## 2020-04-12 MED ORDER — SERTRALINE HCL 50 MG PO TABS: 50.0000 mg | ORAL_TABLET | Freq: Every day | ORAL | 11 refills | Status: AC

## 2020-04-12 NOTE — Progress Notes (Signed)
TELEHEALTH VIRTUAL POSTPARTUM VISIT ENCOUNTER NOTE Patient name: Kristy Fowler MRN [id number]  Date of birth: 17-Mar-1986  I connected with patient on 04/12/20 at 11:50 AM EDT by MyChart video  and verified that I am speaking with the correct person using two identifiers. Pt is not currently in our office, she is at home.  The provider is in the office.    I discussed the limitations, risks, security and privacy concerns of performing an evaluation and management service by telephone and the availability of in person appointments. I also discussed with the patient that there may be a patient responsible charge related to this service. The patient expressed understanding and agreed to proceed.  Chief Complaint:   Postpartum Care (possible increase Zoloft)  History of Present Illness:   Kristy Fowler is a 34 y.o. [phone] female being evaluated today for a postpartum visit. She is 5 weeks postpartum following a spontaneous vaginal delivery at 38.2 gestational weeks after IOL for GHTN/T2DM. Anesthesia: epidural. 30sec shoulder dystocia resolved by mcroberts and delivery of posterior arm. Had postpartum BTL while in hospital. I have fully reviewed the prenatal and intrapartum course. Pregnancy complicated by I7OM on metformin and GHTN dx on admission. PCP Dr. Laurann Montana manages her DM when not pregnant, hasn't seen her yet since delivery. Postpartum course has been uncomplicated. Has dep/anx, on zoloft 56m daily, feels needs to be increased to 531md/t dwelling on things out of her control, etc. Denies SI/HI/II. Still doing therapy q2wks w/ [facility].  Bleeding no bleeding. Bowel function is abnormal: constipation. Bladder function is normal.  Patient is not sexually active. Last sexual activity: prior to birth of baby.   No LMP recorded.  Baby's course has been uncomplicated. Baby is feeding by breast & bottle    Upstream - 04/12/20 1211      Pregnancy Intention Screening   Does the patient  want to become pregnant in the next year? N/A    Does the patient's partner want to become pregnant in the next year? N/A    Would the patient like to discuss contraceptive options today? N/A      Contraception Wrap Up   Current Method Female Sterilization    Contraception Counseling Provided No          The pregnancy intention screening data noted above was reviewed. Potential methods of contraception were discussed. The patient elected to proceed with Female Sterilization.   Edinburgh Postpartum Depression Screening: negative  Edinburgh Postnatal Depression Scale - 04/12/20 1209      Edinburgh Postnatal Depression Scale:  In the Past 7 Days   I have been able to laugh and see the funny side of things. 0    I have looked forward with enjoyment to things. 0    I have blamed myself unnecessarily when things went wrong. 1    I have been anxious or worried for no good reason. 2    I have felt scared or panicky for no good reason. 0    Things have been getting on top of me. 1    I have been so unhappy that I have had difficulty sleeping. 0    I have felt sad or miserable. 0    I have been so unhappy that I have been crying. 0    The thought of harming myself has occurred to me. 0    Edinburgh Postnatal Depression Scale Total 4          Review of Systems:  Pertinent items are noted in HPI Denies Abnormal vaginal discharge w/ itching/odor/irritation, headaches, visual changes, shortness of breath, chest pain, abdominal pain, severe nausea/vomiting, or problems with urination or bowel movements. Pertinent History Reviewed:  Reviewed past medical,surgical, obstetrical and family history.  Reviewed problem list, medications and allergies. OB History  Gravida Para Term Preterm AB Living  '4 3 3   1 3  ' SAB TAB Ectopic Multiple Live Births    1   0 3    # Outcome Date GA Lbr Len/2nd Weight Sex Delivery Anes PTL Lv  4 Term 03/06/20 26w2d04:08 / 00:04 8 lb 3.4 oz (3.725 kg) M  Vag-Spont EPI  LIV  3 Term 11/21/12 324w4d5:09 / 00:08 8 lb 5 oz (3.77 kg) F Vag-Spont None N LIV  2 TAB 2008 7w99w0d      1 Term 06/07/05 37w71w0dlb 8 oz (3.402 kg) M Vag-Spont None  LIV     Complications: Gestational hypertension   Physical Assessment:  There were no vitals filed for this visit.There is no height or weight on file to calculate BMI.       Physical Examination:  General:  Alert, oriented and cooperative.   Mental Status: Normal mood and affect perceived. Normal judgment and thought content.  Rest of physical exam deferred due to type of encounter       No results found for this or any previous visit (from the past 24 hour(s)).  Assessment & Plan:  1) Postpartum exam 2) 5 wks s/p SVB w/ mild shoulder dystocia 3) Breast and bottlefeeding 4) Dep/anx> increase zoloft to 50mg75m pt request, f/u 4wks, continue therapy q 2wks w/ [facility] 5) T2DM> continue metformin 500mg 84my, f/u w/ PCP 6) Constipation> gave printed prevention/relief measures   Essential components of care per ACOG recommendations:  1.  Mood and well being:  . Patient with negative depression screening today.  . Pre-existing mental health disorders? Yes  . Patient does not use tobacco.  . Substance use disorder? No   2. Infant care and feeding:  . Patient currently breastfeeding? Yes  . Childcare strategy if returning to work/school: n/a . Infant has a pediatrician/family doctor? Yes  . Recommended that all caregivers be immunized for flu, pertussis and other preventable communicable diseases . Pt does have material needs met such as stable housing, utilities, food and diapers. If not, referred to local resources for help obtaining these.  3. Sexuality, contraception and birth spacing . Provided guidance regarding sexuality, management of dyspareunia, and resumption of intercourse . Patient does not want a pregnancy in the future.  Desired family size is 3 children.  . Discussed avoiding  interpregnancy interval <6mths 20m recommended birth spacing of 18 months  4. Sleep and fatigue . Discussed coping options for fatigue and sleep disruption . Encouraged family/partner/community support of 4 hrs of uninterrupted sleep to help with mood and fatigue  5. Physical recovery  . Pt did not have a cesarean section. If yes, assessed incisional pain and providing guidance on normal vs prolonged recovery . Patient had a none laceration, perineal healing and pain reviewed.  . Patient has urinary incontinence? No, fecal incontinence? No  . Patient is safe to resume physical activity. Discussed attainment of healthy weight.  6.  Chronic disease management . Discussed pregnancy complications if any, and their implications for future childbearing and long-term maternal health. . Review recommendations for prevention of recurrent pregnancy complications, such as 17 hydroxyprogesterone caproate to  reduce risk for recurrent PTB not applicable, or aspirin to reduce risk of preeclampsia n/a- s/p BTL. . Pt had GDM: No, has T2DM, continue metformin 561m daily, f/u PCP . Reviewed medications and non-pregnant dosing including consideration of whether pt is breastfeeding using a reliable resource such as LactMed: not applicable . Referred for f/u w/ PCP or subspecialist providers as indicated: yes  7. Health maintenance . Last pap smear 02/18/19 and results were normal . Mammogram at 34yo or earlier if indicated  Meds:  Meds ordered this encounter  Medications  . sertraline (ZOLOFT) 50 MG tablet    Sig: Take 1 tablet (50 mg total) by mouth daily.    Dispense:  30 tablet    Refill:  11    Order Specific Question:   Supervising Provider    Answer:   EFlorian Buff[[id number]]    The patient was advised to call back or seek an in-person evaluation/go to the ED for any concerning postpartum symptoms.  I provided 15 minutes of non-face-to-face time during this encounter.  Follow-up: Return in about  4 weeks (around 05/10/2020) for med f/u.   No orders of the defined types were placed in this encounter.   [place] [facility]9/02/2020 1:06 PM

## 2020-04-12 NOTE — Patient Instructions (Addendum)
Tips To Increase Milk Supply  Lots of water! Enough so that your urine is clear  Plenty of calories, if you're not getting enough calories, your milk supply can decrease  Breastfeed/pump often, every 2-3 hours x 20-20mins  Fenugreek 3 pills 3 times a day, this may make your urine smell like maple syrup  Mother's Milk Tea  Lactation cookies, google for the recipe  Real oatmeal  Body Armor sports drinks    Constipation  Drink plenty of fluid, preferably water, throughout the day  Eat foods high in fiber such as fruits, vegetables, and grains  Exercise, such as walking, is a good way to keep your bowels regular  Drink warm fluids, especially warm prune juice, or decaf coffee  Eat a 1/2 cup of real oatmeal (not instant), 1/2 cup applesauce, and 1/2-1 cup warm prune juice every day  If needed, you may take Colace (docusate sodium) stool softener once or twice a day to help keep the stool soft. If you are pregnant, wait until you are out of your first trimester (12-14 weeks of pregnancy)  If you still are having problems with constipation, you may take Miralax once daily as needed to help keep your bowels regular.  If you are pregnant, wait until you are out of your first trimester (12-14 weeks of pregnancy)

## 2020-05-10 ENCOUNTER — Telehealth (INDEPENDENT_AMBULATORY_CARE_PROVIDER_SITE_OTHER): Payer: Medicaid Other | Admitting: Women's Health

## 2020-05-10 ENCOUNTER — Encounter: Payer: Self-pay | Admitting: Women's Health

## 2020-05-10 VITALS — BP 132/78 | HR 87 | Ht 64.0 in | Wt 227.0 lb

## 2020-05-10 DIAGNOSIS — F53 Postpartum depression: Secondary | ICD-10-CM

## 2020-05-10 DIAGNOSIS — O99345 Other mental disorders complicating the puerperium: Secondary | ICD-10-CM

## 2020-05-10 NOTE — Progress Notes (Signed)
TELEHEALTH VIRTUAL GYN VISIT ENCOUNTER NOTE Patient name: Kristy Fowler MRN [id number]  Date of birth: 07-13-1986  I connected with patient on 05/10/20 at  1:50 PM EDT by MyChart video  and verified that I am speaking with the correct person using two identifiers.  Pt is not currently in the office, she is at home.  Provider is in the office.    I discussed the limitations, risks, security and privacy concerns of performing an evaluation and management service by telephone and the availability of in person appointments. I also discussed with the patient that there may be a patient responsible charge related to this service. The patient expressed understanding and agreed to proceed.   Chief Complaint:   Follow-up (on Zoloft; seems to be working)  History of Present Illness:   Kristy Fowler is a 34 y.o. [phone] female 2 mths s/p SVB being evaluated today for increase in zoloft to 50mg  at postpartum visit on 9/7.  EPDS was 4 on 9/7, now 2. States she definitely feels the increase has helped a lot. Denies SI/HI/II. Initial bp elevated, repeat normal. Has PCP in [place] that manages DM, hasn't seen since had baby.  Depression screen [facility] 2/9 12/24/2019 10/29/2019 09/09/2019 02/18/2019 12/19/2016  Decreased Interest 0 0 0 0 0  Down, Depressed, Hopeless 2 0 0 0 0  PHQ - 2 Score 2 0 0 0 0  Altered sleeping 3 - 1 - -  Tired, decreased energy 1 - 1 - -  Change in appetite 1 - 1 - -  Feeling bad or failure about yourself  0 - 0 - -  Trouble concentrating 1 - 1 - -  Moving slowly or fidgety/restless 0 - 1 - -  Suicidal thoughts 0 - - - -  PHQ-9 Score 8 - 5 - -  Difficult doing work/chores Not difficult at all - - - -    No LMP recorded. The current method of family planning is tubal ligation.  Last pap 02/18/19. Results were:  normal Review of Systems:   Pertinent items are noted in HPI Denies fever/chills, dizziness, headaches, visual disturbances, fatigue, shortness of breath, chest pain,  abdominal pain, vomiting, abnormal vaginal discharge/itching/odor/irritation, problems with periods, bowel movements, urination, or intercourse unless otherwise stated above.  Pertinent History Reviewed:  Reviewed past medical,surgical, social, obstetrical and family history.  Reviewed problem list, medications and allergies. Physical Assessment:   Vitals:   05/10/20 1353 05/10/20 1401  BP: (!) 149/83 132/78  Pulse: 86 87  Weight: 227 lb (103 kg)   Height: 5\' 4"  (1.626 m)   Body mass index is 38.96 kg/m.       Physical Examination:   General:  Alert, oriented and cooperative.   Mental Status: Normal mood and affect perceived. Normal judgment and thought content.  Physical exam deferred due to nature of the encounter  No results found for this or any previous visit (from the past 24 hour(s)).  Assessment & Plan:  1) PPD> doing well on zoloft 50mg  daily, continue  2) Initially elevated bp> normal on repeat, h/o GHTN, to make appt w/ PCP for f/u  Meds: No orders of the defined types were placed in this encounter.   No orders of the defined types were placed in this encounter.   I discussed the assessment and treatment plan with the patient. The patient was provided an opportunity to ask questions and all were answered. The patient agreed with the plan and demonstrated an understanding of  the instructions.   The patient was advised to call back or seek an in-person evaluation/go to the ED if the symptoms worsen or if the condition fails to improve as anticipated.  I provided 15 minutes of non-face-to-face time during this encounter.   Return in about 1 year (around 05/10/2021) for Physical.  Cheral Marker CNM, WHNP-BC 05/10/2020 2:12 PM

## 2020-07-14 ENCOUNTER — Encounter: Payer: Self-pay | Admitting: General Practice

## 2020-08-18 ENCOUNTER — Ambulatory Visit: Payer: Medicaid Other | Attending: Internal Medicine

## 2020-08-18 DIAGNOSIS — Z23 Encounter for immunization: Secondary | ICD-10-CM

## 2020-08-18 NOTE — Progress Notes (Signed)
   Covid-19 Vaccination Clinic  Name:  Kristy Fowler    MRN: [id number] DOB: 1986/04/18  08/18/2020  Kristy Fowler was observed post Covid-19 immunization for 15 minutes without incident. She was provided with Vaccine Information Sheet and instruction to access the V-Safe system.   Kristy Fowler was instructed to call 911 with any severe reactions post vaccine: Marland Kitchen Difficulty breathing  . Swelling of face and throat  . A fast heartbeat  . A bad rash all over body  . Dizziness and weakness   Immunizations Administered    Name Date Dose VIS Date Route   Pfizer COVID-19 Vaccine 08/18/2020  1:08 PM 0.3 mL 05/25/2020 Intramuscular   Manufacturer: [employer], [employer]   Lot: [id number]   NDC: [address]-8

## 2020-09-08 ENCOUNTER — Ambulatory Visit: Payer: Medicaid Other | Attending: Internal Medicine

## 2020-09-08 DIAGNOSIS — Z23 Encounter for immunization: Secondary | ICD-10-CM

## 2020-09-08 NOTE — Progress Notes (Signed)
   Covid-19 Vaccination Clinic  Name:  Kristy Fowler    MRN: [id number] DOB: 07/10/1986  09/08/2020  Kristy Fowler was observed post Covid-19 immunization for 15 minutes without incident. She was provided with Vaccine Information Sheet and instruction to access the V-Safe system.   Kristy Fowler was instructed to call 911 with any severe reactions post vaccine: Marland Kitchen Difficulty breathing  . Swelling of face and throat  . A fast heartbeat  . A bad rash all over body  . Dizziness and weakness   Immunizations Administered    Name Date Dose VIS Date Route   PFIZER Comrnaty(Gray TOP) Covid-19 Vaccine 09/08/2020  1:18 PM 0.3 mL 07/14/2020 Intramuscular   Manufacturer: [employer], [employer]   Lot: [id number]   NDC: [phone]

## 2021-04-25 IMAGING — US US OB LIMITED
1 series · 14 of 28 positions shown · non-contrast
Comparison: none

CLINICAL DATA: Vaginal spotting 3 times yesterday. Eighteen weeks
and 2 days pregnant by last menstrual period.

EXAM:
LIMITED OBSTETRIC ULTRASOUND

[Series 1: us ob limited · 0.20mm/px · 57 acquisitions, 14 frames shown]
[im 3/57]
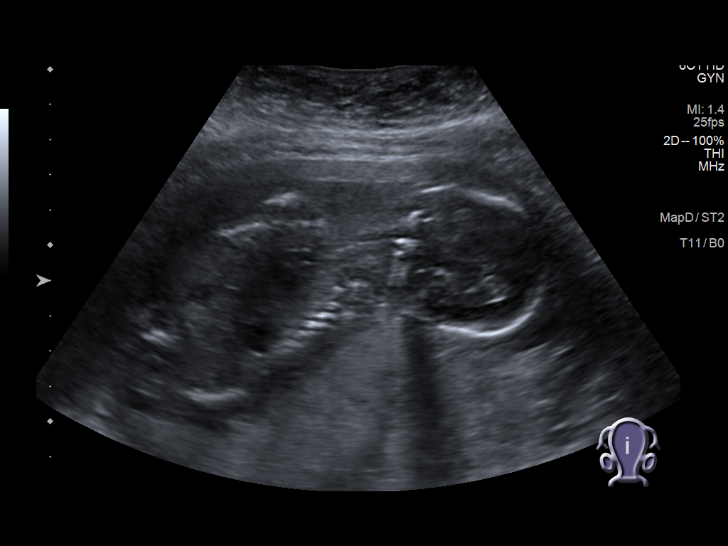
[im 7/57]
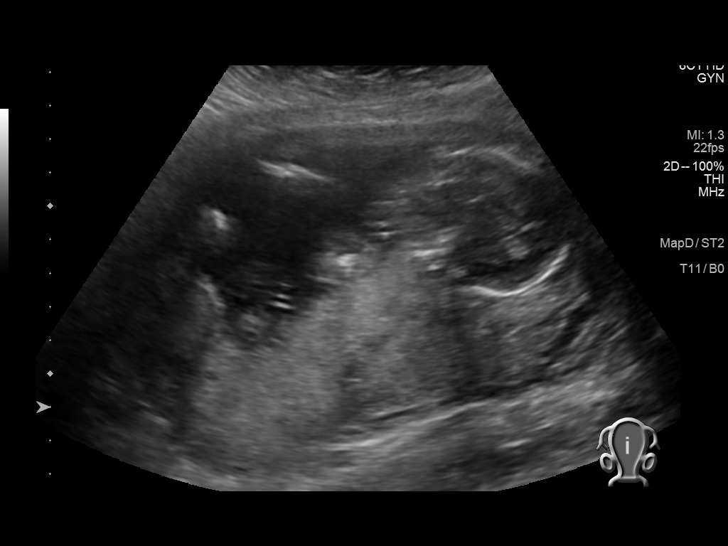
[im 11/57]
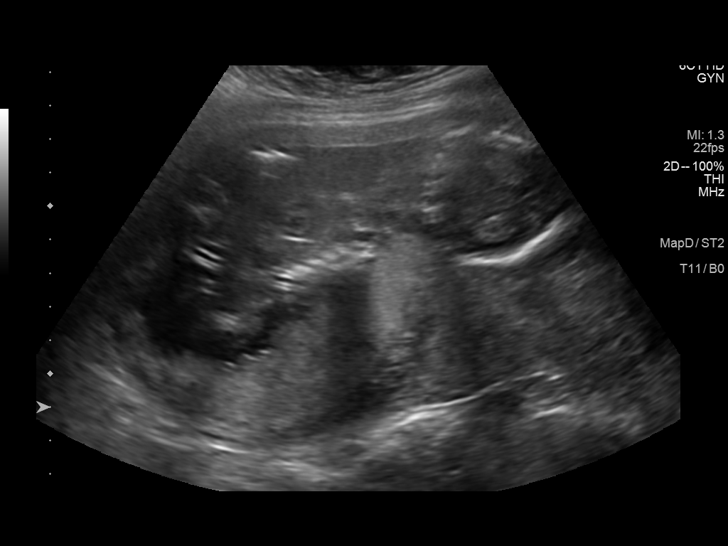
[im 15/57]
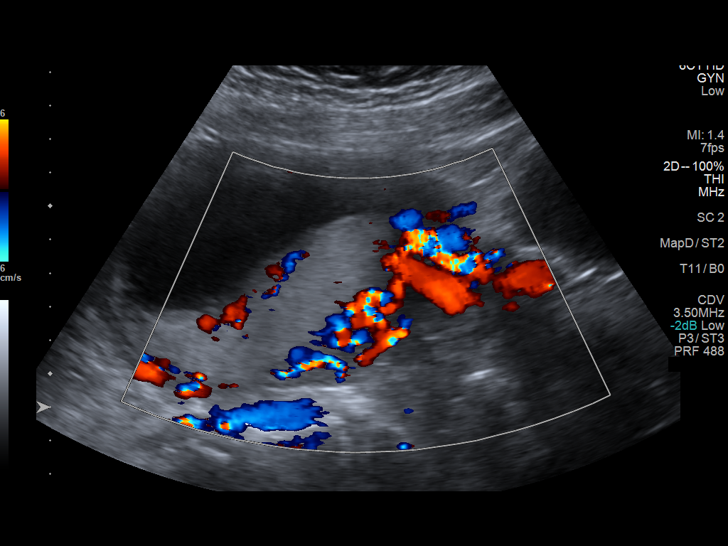
[im 19/57]
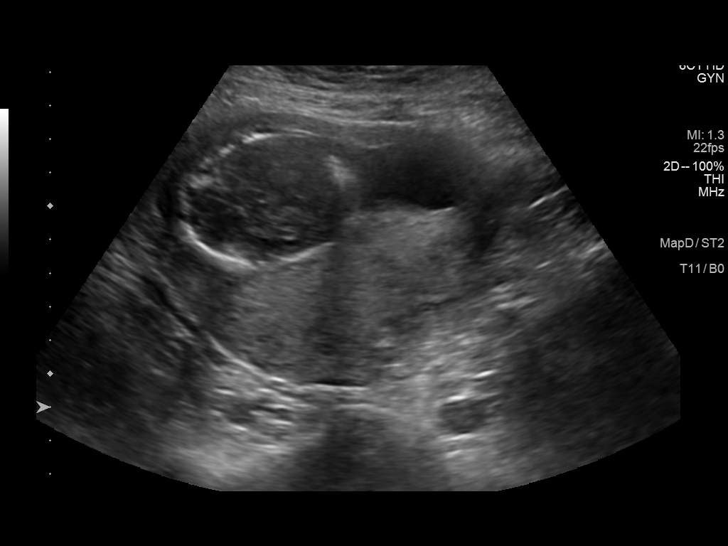
[im 23/57]
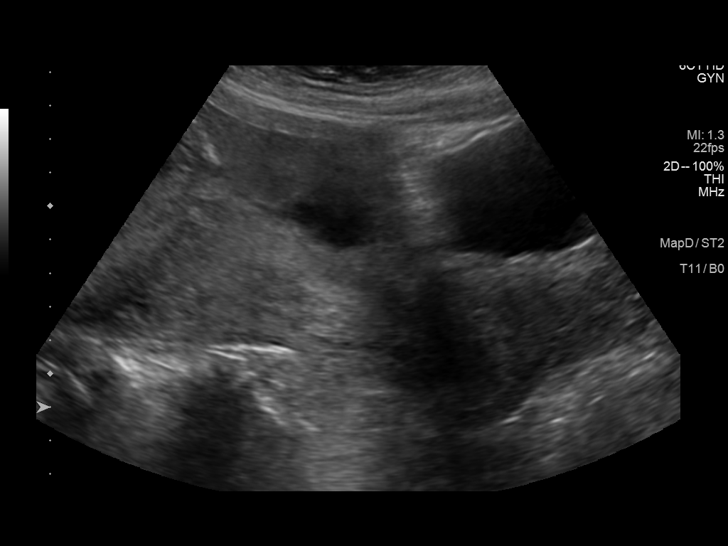
[im 27/57]
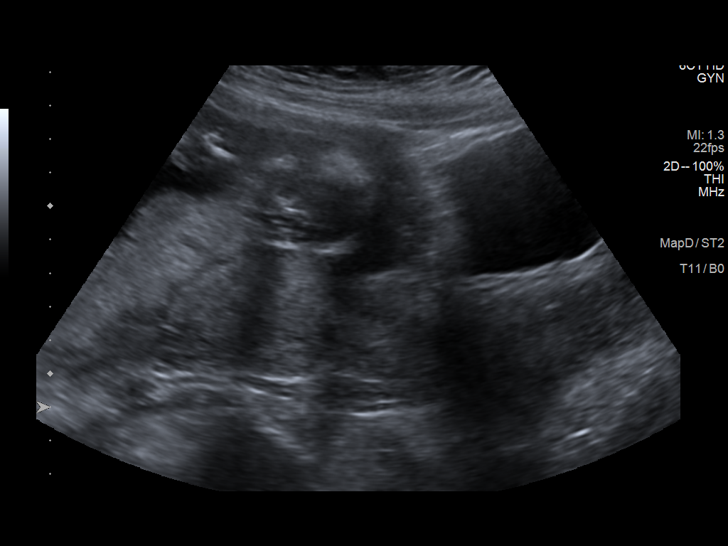
[im 32/57]
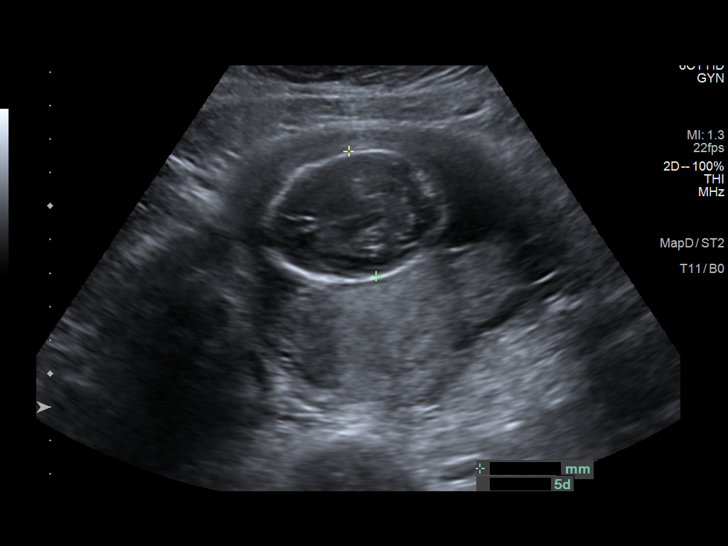
[im 36/57]
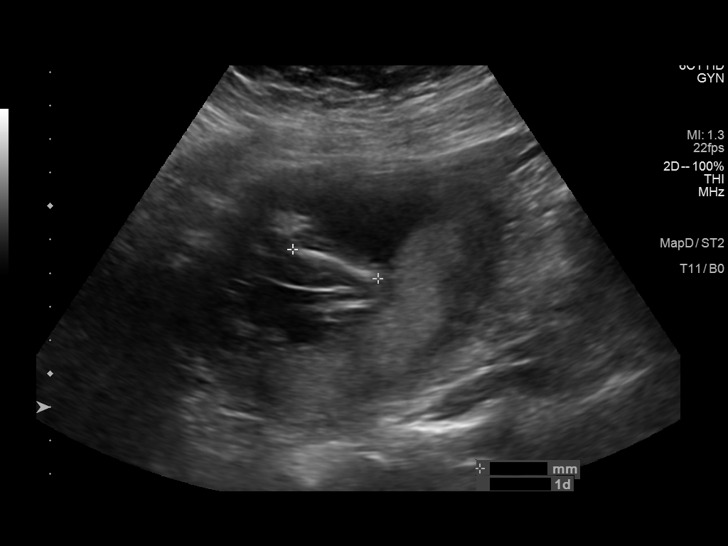
[im 40/57]
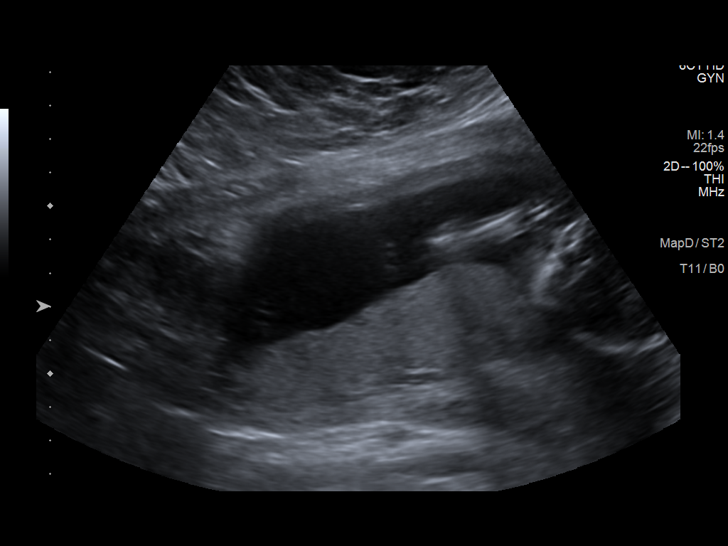
[im 44/57]
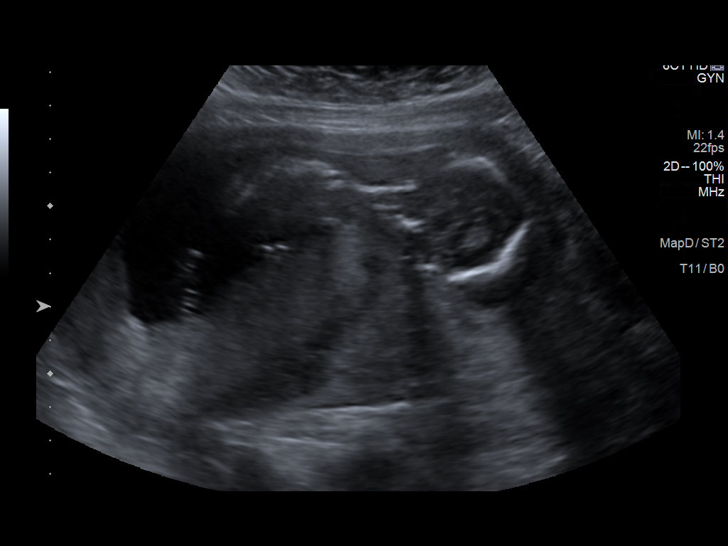
[im 48/57]
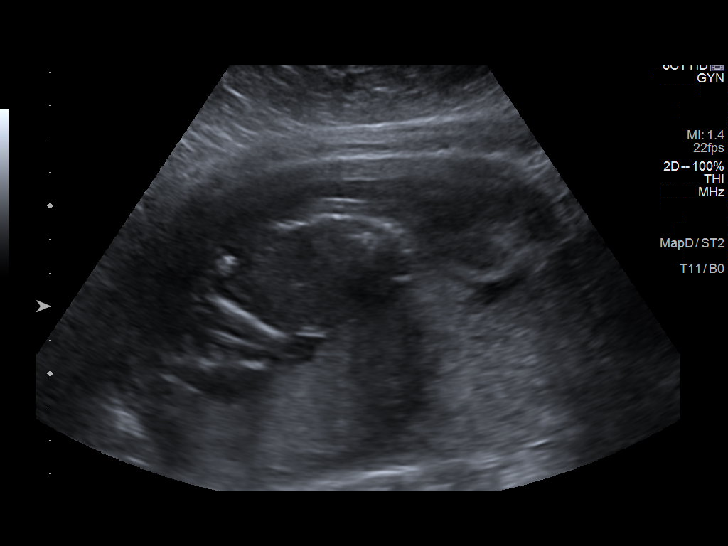
[im 52/57]
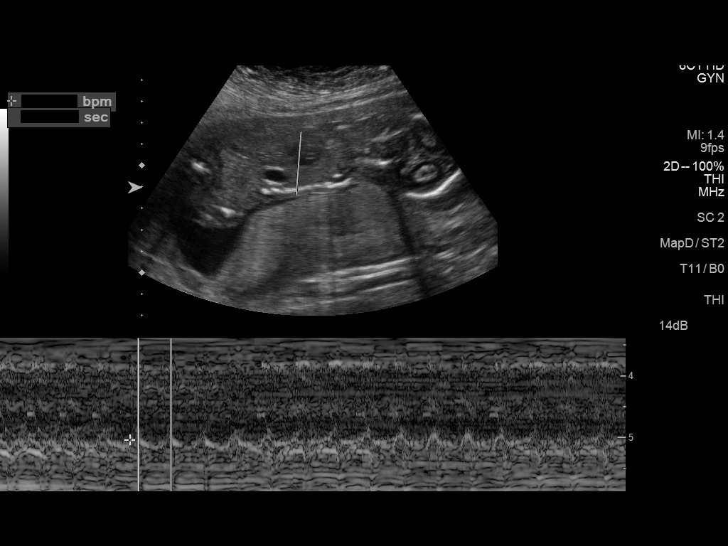
[im 57/57]
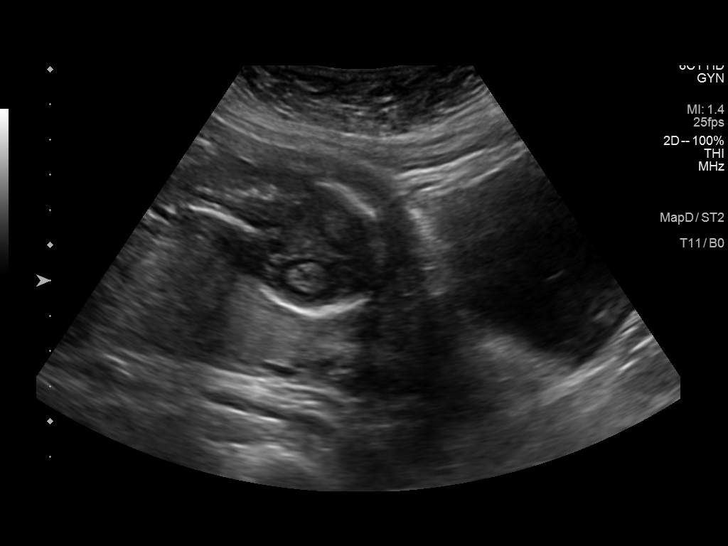

[14 of 28 positions shown; findings below may reference images not displayed]

FINDINGS: Number of Fetuses: 1

Heart Rate:  157 bpm

Movement: Visualized

Presentation: Cephalic

Placental Location: Posterior

Previa: Marginal

Amniotic Fluid (Subjective):  Within normal limits.

BPD: 3.89 cm 17 w  6 d

MATERNAL FINDINGS:

Cervix:  Appears closed.

Uterus/Adnexae: No abnormality visualized.
IMPRESSION: 1. Single live intrauterine gestation in a vertex presentation with
an estimated gestational age by BPD of 17 weeks and 6 days.
2. Posterior placenta with marginal previa. No retroplacental or pre
placental hemorrhage seen.

This exam is performed on an emergent basis and does not
comprehensively evaluate fetal size, dating, or anatomy; follow-up
complete OB US should be considered if further fetal assessment is
warranted.

## 2021-11-06 ENCOUNTER — Ambulatory Visit
Admission: RE | Admit: 2021-11-06 | Discharge: 2021-11-06 | Disposition: A | Discharge status: Home / Self Care | Payer: Medicaid Other | Source: Ambulatory Visit | Attending: Family Medicine | Admitting: Family Medicine

## 2021-11-06 ENCOUNTER — Other Ambulatory Visit: Payer: Self-pay | Admitting: Family Medicine

## 2021-11-06 DIAGNOSIS — R52 Pain, unspecified: Secondary | ICD-10-CM

## 2021-12-13 ENCOUNTER — Encounter: Payer: Self-pay | Admitting: Adult Health

## 2021-12-13 ENCOUNTER — Ambulatory Visit: Payer: Medicaid Other | Admitting: Adult Health

## 2021-12-13 ENCOUNTER — Other Ambulatory Visit (HOSPITAL_COMMUNITY)
Admission: RE | Admit: 2021-12-13 | Discharge: 2021-12-13 | Disposition: A | Discharge status: Home / Self Care | Payer: Medicaid Other | Source: Ambulatory Visit | Attending: Adult Health | Admitting: Adult Health

## 2021-12-13 VITALS — BP 123/74 | HR 86 | Ht 64.0 in | Wt 257.0 lb

## 2021-12-13 DIAGNOSIS — R1032 Left lower quadrant pain: Secondary | ICD-10-CM

## 2021-12-13 DIAGNOSIS — N920 Excessive and frequent menstruation with regular cycle: Secondary | ICD-10-CM | POA: Insufficient documentation

## 2021-12-13 DIAGNOSIS — Z124 Encounter for screening for malignant neoplasm of cervix: Secondary | ICD-10-CM

## 2021-12-13 NOTE — Progress Notes (Signed)
?  Subjective:  ?  ? Patient ID: Kristy Fowler, female   DOB: Aug 27, 1985, 36 y.o.   MRN: [id number] ? ?HPI ?Kristy Fowler is a 36 year old white female,married, [id number] in complaining of pain left ovary pain and heavy periods. ?And she needs a pap. ? ?Lab Results  ?Component Value Date  ? DIAGPAP  02/18/2019  ?  NEGATIVE FOR INTRAEPITHELIAL LESIONS OR MALIGNANCY.  ? HPV NOT DETECTED 02/18/2019  ? PCP is Dr Kelton Pillar ?Review of Systems ?LLQ pain ?Has heavy periods, lasts 5 days, 3 heavy, nay change every hour ?Some cramps ?Denies any problems with urination,BMs, sex, no dizziness or fatigue ?Reviewed past medical,surgical, social and family history. Reviewed medications and allergies.  ?   ?Objective:  ? Physical Exam ?BP 123/74 (BP Location: Left Arm, Patient Position: Sitting, Cuff Size: Normal)   Pulse 86   Ht 5\' 4"  (1.626 m)   Wt 257 lb (116.6 kg)   LMP 11/21/2021   Breastfeeding No   BMI 44.11 kg/m?   ?  Skin warm and dry.Pelvic: external genitalia is normal in appearance no lesions, vagina: white discharge without odor,urethra has no lesions or masses noted, cervix:bulbous,pap with HR HPV performed, uterus: normal size, shape and contour, non tender, no masses felt, adnexa: no masses or tenderness noted. Bladder is non tender and no masses felt. ?Fall risk is low ? ?  12/13/2021  ?  2:28 PM 12/24/2019  ?  9:11 AM 10/29/2019  ?  2:43 PM  ?Depression screen PHQ 2/9  ?Decreased Interest 0 0 0  ?Down, Depressed, Hopeless 0 2 0  ?PHQ - 2 Score 0 2 0  ?Altered sleeping  3   ?Tired, decreased energy  1   ?Change in appetite  1   ?Feeling bad or failure about yourself   0   ?Trouble concentrating  1   ?Moving slowly or fidgety/restless  0   ?Suicidal thoughts  0   ?PHQ-9 Score  8   ?Difficult doing work/chores  Not difficult at all   ?  ? [place] - 12/13/21 1414   ? ?  ? Pregnancy Intention Screening  ? Does the patient want to become pregnant in the next year? No   ? Does the patient's partner want to become  pregnant in the next year? No   ? Would the patient like to discuss contraceptive options today? No   ?  ? Contraception Wrap Up  ? Current Method Female Sterilization   ? End Method Female Sterilization   ? Contraception Counseling Provided No   ? ?  ?  ? ?  ? Examination chaperoned by Genia Hotter RN ? ?Assessment:  ?   ?1. LLQ pain ?Will get [facility] at [facility] 12/21[facility]21 at 11:30 am to assess uterus and ovaries  ?Will talk when results, and talk options then  ? ?2. Menorrhagia with regular cycle ?[place] 12/21/21 at [facility]  ? ?3. Routine Papanicolaou smear ?Pap sent ?Pap in 3 years if normal ?   ?Plan:  ?   ?Follow TBD ?   ?

## 2021-12-13 NOTE — Addendum Note (Signed)
Addended by: Moss Mc on: 12/13/2021 03:25 PM ? ? Modules accepted: Orders ? ?

## 2021-12-18 LAB — CYTOLOGY - PAP
Comment: NEGATIVE
Diagnosis: NEGATIVE
High risk HPV: NEGATIVE

## 2021-12-21 ENCOUNTER — Other Ambulatory Visit (HOSPITAL_COMMUNITY): Payer: Medicaid Other

## 2021-12-25 ENCOUNTER — Ambulatory Visit (HOSPITAL_COMMUNITY)
Admission: RE | Admit: 2021-12-25 | Discharge: 2021-12-25 | Disposition: A | Discharge status: Home / Self Care | Payer: Medicaid Other | Source: Ambulatory Visit | Attending: Adult Health | Admitting: Adult Health

## 2021-12-25 DIAGNOSIS — R1032 Left lower quadrant pain: Secondary | ICD-10-CM | POA: Diagnosis present

## 2021-12-25 DIAGNOSIS — N920 Excessive and frequent menstruation with regular cycle: Secondary | ICD-10-CM | POA: Insufficient documentation

## 2022-03-27 ENCOUNTER — Other Ambulatory Visit (INDEPENDENT_AMBULATORY_CARE_PROVIDER_SITE_OTHER): Payer: Medicaid Other

## 2022-03-27 ENCOUNTER — Other Ambulatory Visit (HOSPITAL_COMMUNITY)
Admission: RE | Admit: 2022-03-27 | Discharge: 2022-03-27 | Disposition: A | Discharge status: Home / Self Care | Payer: Medicaid Other | Source: Ambulatory Visit | Attending: Obstetrics & Gynecology | Admitting: Obstetrics & Gynecology

## 2022-03-27 DIAGNOSIS — Z113 Encounter for screening for infections with a predominantly sexual mode of transmission: Secondary | ICD-10-CM | POA: Diagnosis present

## 2022-03-27 NOTE — Progress Notes (Signed)
   NURSE VISIT- STD  SUBJECTIVE:  Kristy Fowler is a 36 y.o. [id number] GYN patientfemale here for a vaginal swab for STD screen.  She reports the following symptoms: none.  "Husband decided to do something else"  Denies abnormal vaginal bleeding, significant pelvic pain, fever, or UTI symptoms.  OBJECTIVE:  There were no vitals taken for this visit.  Appears well, in no apparent distress  ASSESSMENT: Vaginal swab for STD screen  PLAN: Self-collected vaginal probe for Gonorrhea, Chlamydia, Trichomonas, Bacterial Vaginosis, Yeast sent to lab Treatment: to be determined once results are received Follow-up as needed if symptoms persist/worsen, or new symptoms develop  Jobe Marker  03/27/2022 2:53 PM

## 2022-03-29 LAB — CERVICOVAGINAL ANCILLARY ONLY
Bacterial Vaginitis (gardnerella): POSITIVE — AB
Candida Glabrata: NEGATIVE
Candida Vaginitis: NEGATIVE
Chlamydia: NEGATIVE
Comment: NEGATIVE
Comment: NEGATIVE
Comment: NEGATIVE
Comment: NEGATIVE
Comment: NEGATIVE
Comment: NORMAL
Neisseria Gonorrhea: NEGATIVE
Trichomonas: NEGATIVE

## 2022-04-02 ENCOUNTER — Other Ambulatory Visit: Payer: Self-pay | Admitting: Adult Health

## 2022-04-02 MED ORDER — METRONIDAZOLE 500 MG PO TABS: 500.0000 mg | ORAL_TABLET | Freq: Two times a day (BID) | ORAL | 0 refills | Status: AC

## 2022-04-02 NOTE — Progress Notes (Signed)
+  BV on vaginal swab will rx flagyl 

## 2022-10-04 DIAGNOSIS — M797 Fibromyalgia: Secondary | ICD-10-CM | POA: Diagnosis not present

## 2022-10-04 DIAGNOSIS — K589 Irritable bowel syndrome without diarrhea: Secondary | ICD-10-CM | POA: Diagnosis not present

## 2022-10-04 DIAGNOSIS — E1169 Type 2 diabetes mellitus with other specified complication: Secondary | ICD-10-CM | POA: Diagnosis not present

## 2022-10-04 DIAGNOSIS — Z1159 Encounter for screening for other viral diseases: Secondary | ICD-10-CM | POA: Diagnosis not present

## 2022-10-04 DIAGNOSIS — Q282 Arteriovenous malformation of cerebral vessels: Secondary | ICD-10-CM | POA: Diagnosis not present

## 2022-10-04 DIAGNOSIS — E7800 Pure hypercholesterolemia, unspecified: Secondary | ICD-10-CM | POA: Diagnosis not present

## 2022-10-04 DIAGNOSIS — Z0000 Encounter for general adult medical examination without abnormal findings: Secondary | ICD-10-CM | POA: Diagnosis not present

## 2022-10-04 DIAGNOSIS — F418 Other specified anxiety disorders: Secondary | ICD-10-CM | POA: Diagnosis not present

## 2023-04-26 DIAGNOSIS — Z23 Encounter for immunization: Secondary | ICD-10-CM | POA: Diagnosis not present

## 2023-04-26 DIAGNOSIS — F418 Other specified anxiety disorders: Secondary | ICD-10-CM | POA: Diagnosis not present

## 2023-04-26 DIAGNOSIS — M797 Fibromyalgia: Secondary | ICD-10-CM | POA: Diagnosis not present

## 2023-04-26 DIAGNOSIS — E1169 Type 2 diabetes mellitus with other specified complication: Secondary | ICD-10-CM | POA: Diagnosis not present

## 2023-04-26 DIAGNOSIS — K589 Irritable bowel syndrome without diarrhea: Secondary | ICD-10-CM | POA: Diagnosis not present

## 2023-04-26 DIAGNOSIS — E7800 Pure hypercholesterolemia, unspecified: Secondary | ICD-10-CM | POA: Diagnosis not present

## 2023-05-22 IMAGING — DX DG ANKLE COMPLETE 3+V*L*
3 series · 3 of 3 positions shown · non-contrast
Comparison: None.

CLINICAL DATA: Left medial and lateral ankle pain.

EXAM:
LEFT ANKLE COMPLETE - 3+ VIEW

[dg ankle complete left (1 of 3)]
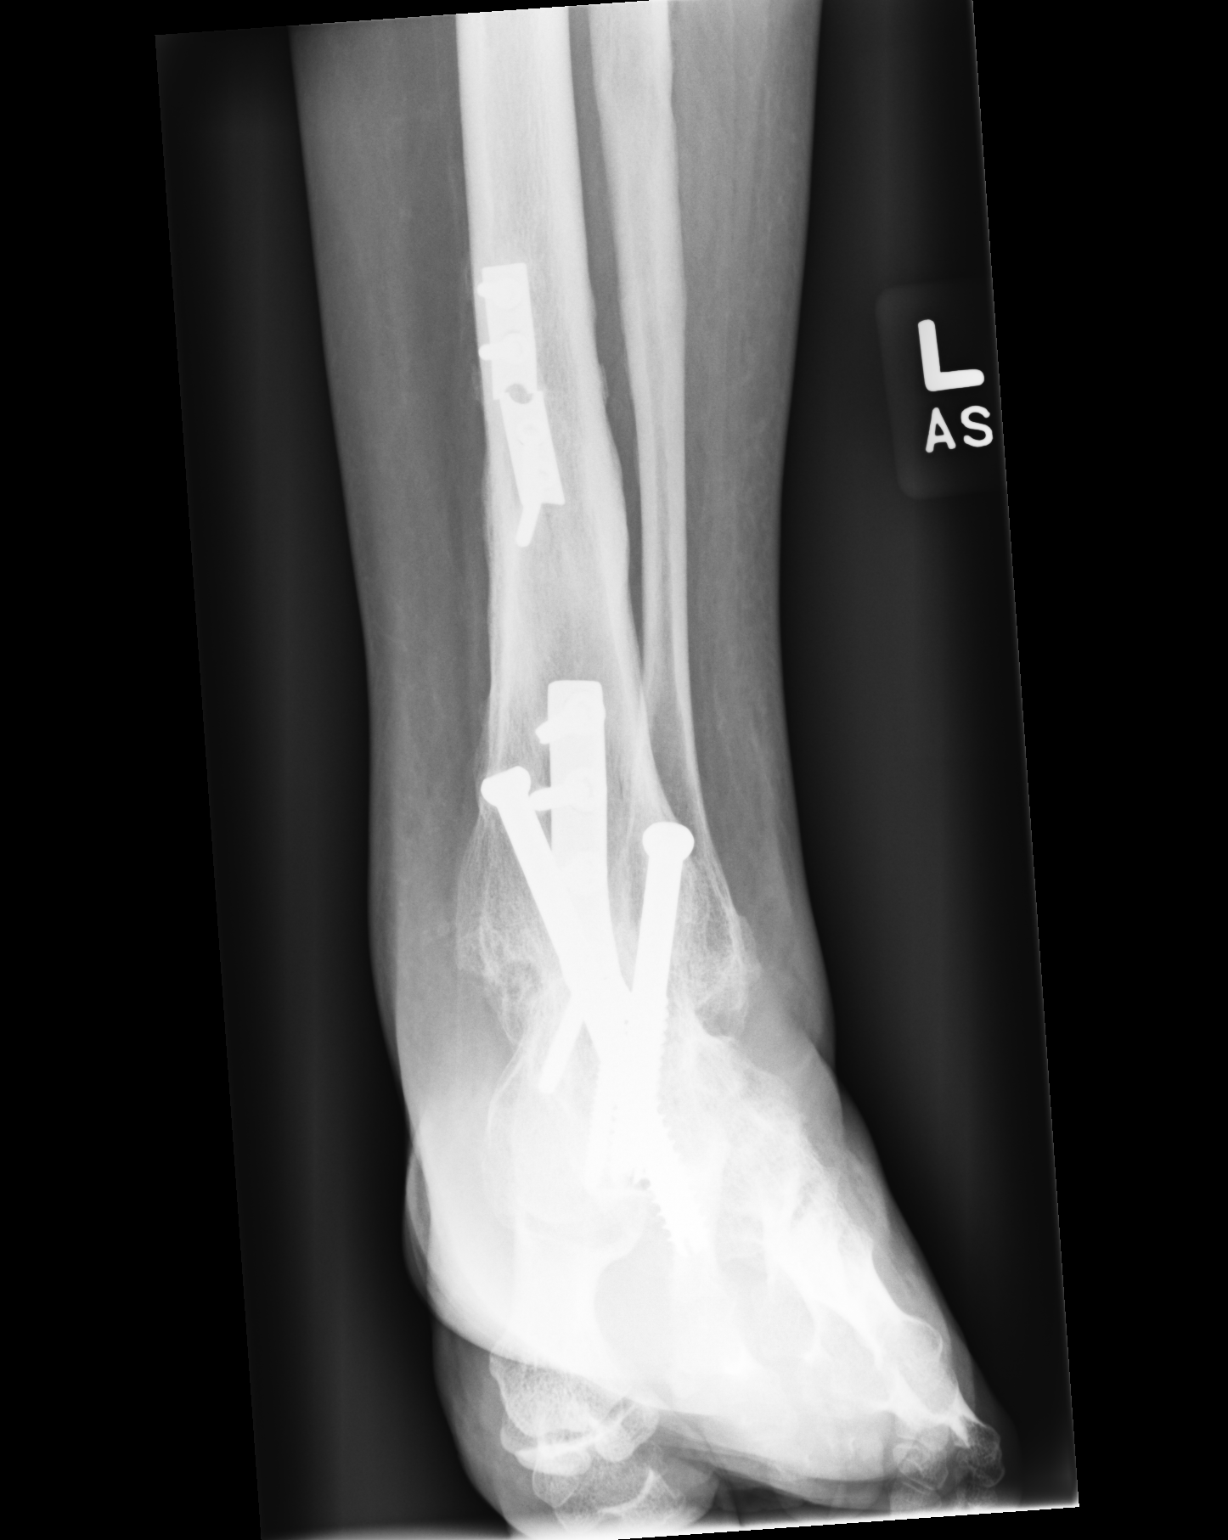

[dg ankle complete left (2 of 3)]
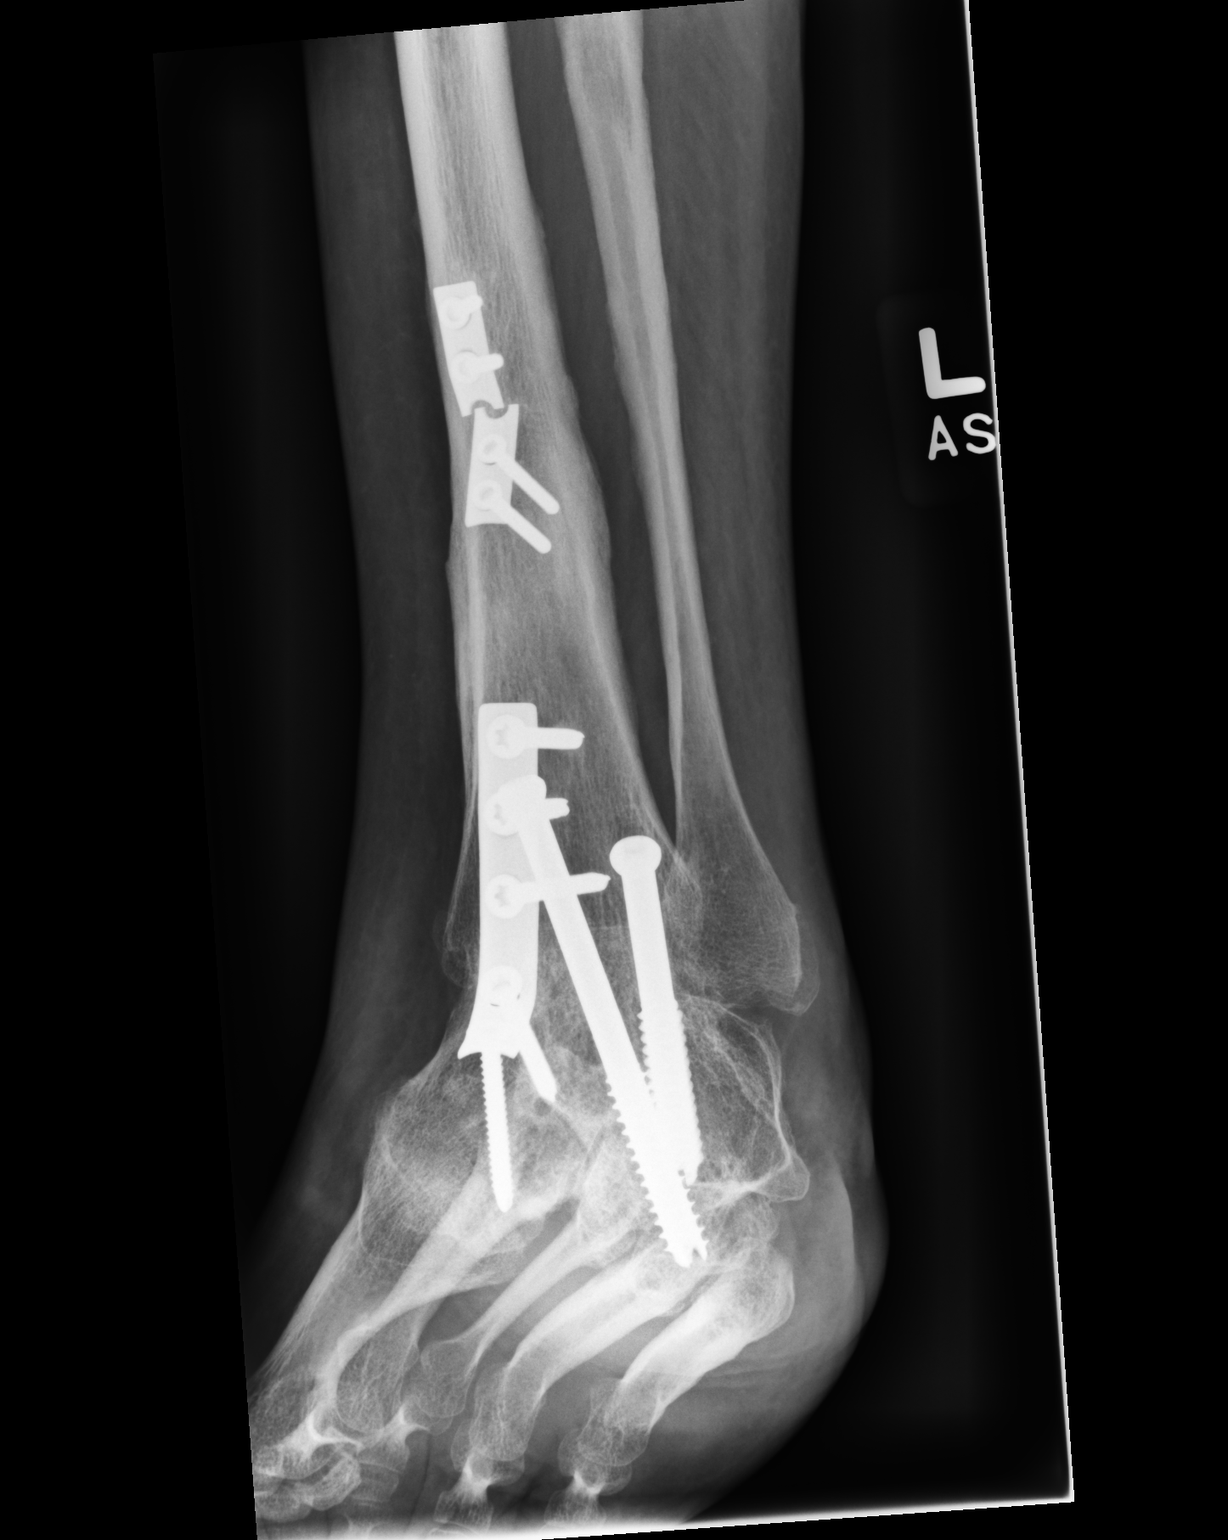

[dg ankle complete left (3 of 3)]
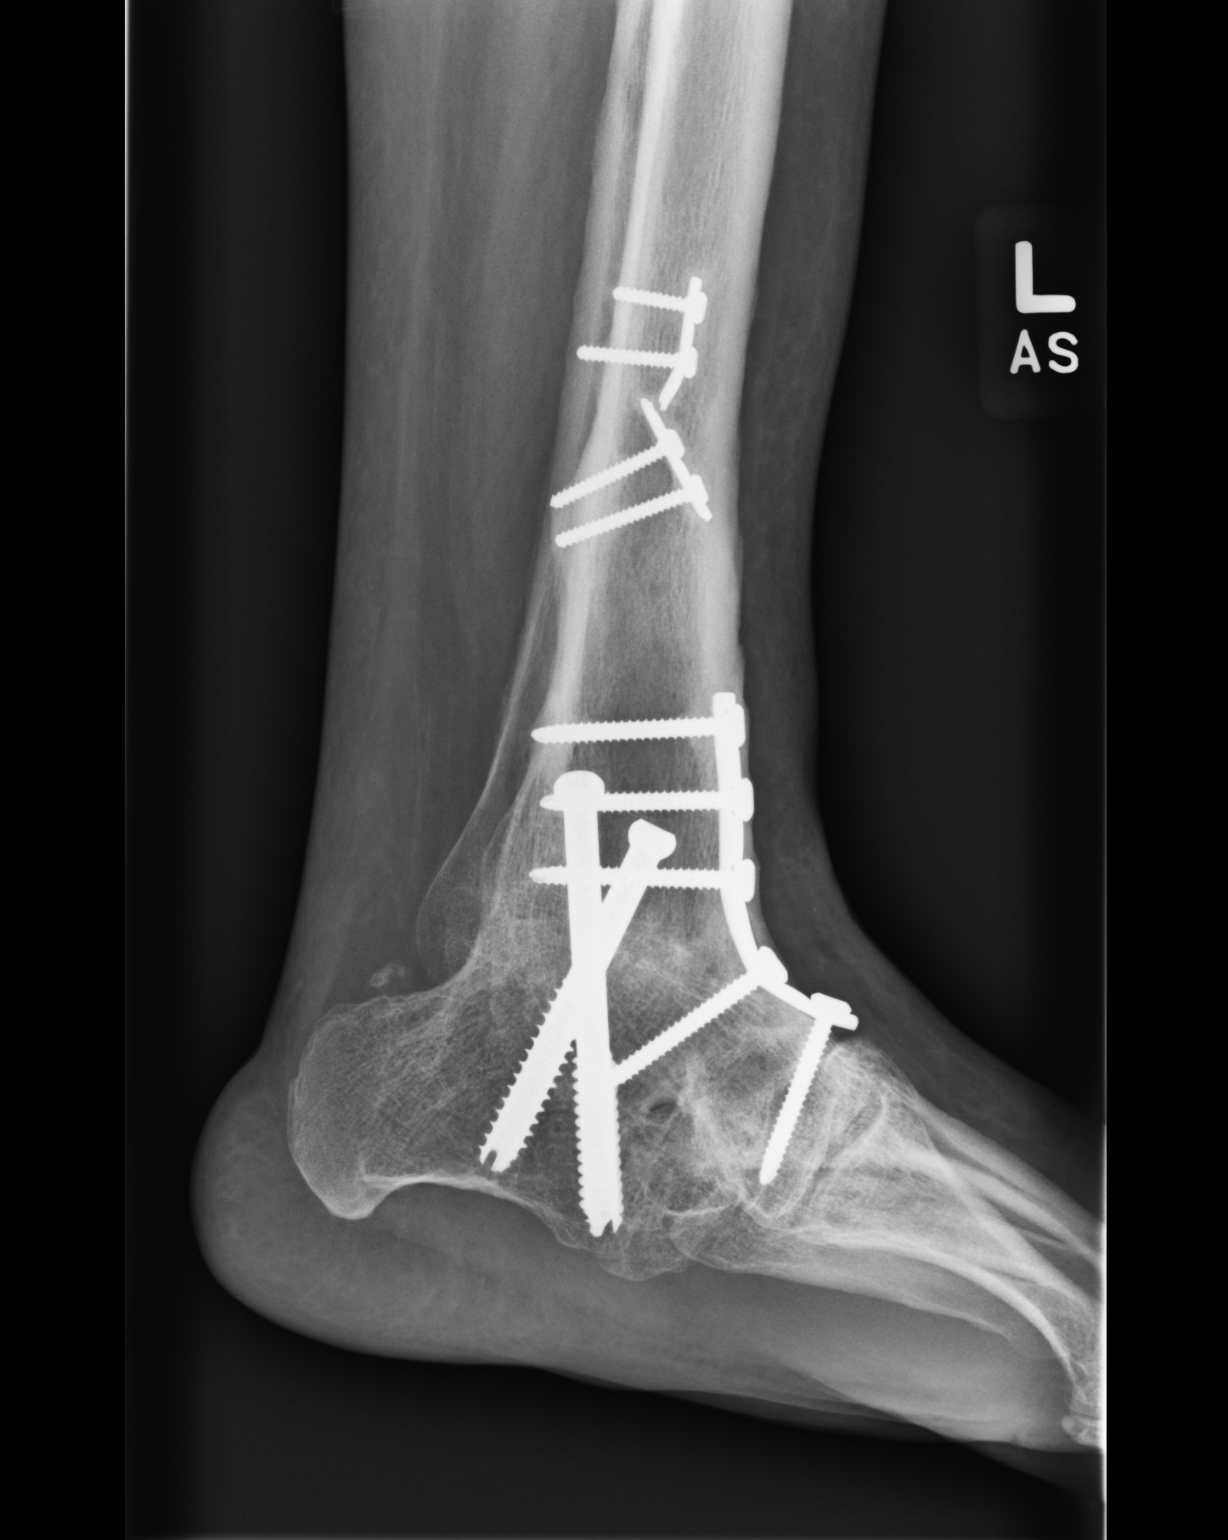

[3 of 3 positions shown; findings below may reference images not displayed]

FINDINGS: Fixation screws and broken plate in the distal tibia with an old,
healed fracture. Screw and plate arthrodesis of the left ankle with
complete bone fusion of the tibia, talus, navicular and calcaneus.
Diffuse soft tissue swelling. No acute fracture or dislocation seen.
Flattening of the normal plantar arch.
IMPRESSION: 1. Extensive postsurgical changes with ankle arthrodesis, as
described above.
2. No acute fracture seen.
3. Pes planus.

## 2023-09-24 ENCOUNTER — Ambulatory Visit
Admission: RE | Admit: 2023-09-24 | Discharge: 2023-09-24 | Disposition: A | Discharge status: Home / Self Care | Payer: 59 | Source: Ambulatory Visit

## 2023-09-24 ENCOUNTER — Encounter: Payer: Self-pay | Admitting: *Deleted

## 2023-09-24 ENCOUNTER — Ambulatory Visit
Admission: RE | Admit: 2023-09-24 | Discharge: 2023-09-24 | Disposition: A | Discharge status: Home / Self Care | Payer: Self-pay | Source: Ambulatory Visit

## 2023-09-24 DIAGNOSIS — R062 Wheezing: Secondary | ICD-10-CM

## 2023-09-24 DIAGNOSIS — J208 Acute bronchitis due to other specified organisms: Secondary | ICD-10-CM | POA: Diagnosis not present

## 2023-09-24 DIAGNOSIS — R000 Tachycardia, unspecified: Secondary | ICD-10-CM

## 2023-09-24 LAB — POC COVID19/FLU A&B COMBO
Covid Antigen, POC: NEGATIVE
Influenza A Antigen, POC: NEGATIVE
Influenza B Antigen, POC: NEGATIVE

## 2023-09-24 MED ORDER — PROMETHAZINE-DM 6.25-15 MG/5ML PO SYRP: 5.0000 mL | ORAL_SOLUTION | Freq: Four times a day (QID) | ORAL | 0 refills | Status: AC | PRN

## 2023-09-24 MED ORDER — ALBUTEROL SULFATE HFA 108 (90 BASE) MCG/ACT IN AERS: 2.0000 | INHALATION_SPRAY | RESPIRATORY_TRACT | 0 refills | Status: AC | PRN

## 2023-09-24 MED ORDER — DEXAMETHASONE SODIUM PHOSPHATE 10 MG/ML IJ SOLN
10.0000 mg | Freq: Once | INTRAMUSCULAR | Status: AC
  Administered 2023-09-24: 10 mg via INTRAMUSCULAR

## 2023-09-24 NOTE — ED Triage Notes (Signed)
Pt states that she started having a fever on Saturday. She complains of cough, congestion, headache and now only fevers at night. She has taken  at home flu and covid tests all neg. She hasn't been using any at home meds for her sx.

## 2023-09-24 NOTE — ED Provider Notes (Signed)
[facility]    CSN: [id number] Arrival date & time: 09/24/23  1024      History   Chief Complaint Chief Complaint  Patient presents with   Cough   Fever   Nasal Congestion   Headache    HPI Kristy Fowler is a 38 y.o. female.   Patient presenting today with 4-day history of fever, cough, congestion, headache, chest tightness, wheezing.  Denies chest pain, significant shortness of breath, abdominal pain, vomiting, diarrhea.  So far trying Tylenol and Vicks vapor rubs with minimal relief.  Tried taking a COVID and flu test at home which was negative.  No known history of chronic pulmonary disease.    Past Medical History:  Diagnosis Date   Anxiety    BV (bacterial vaginosis) 08/24/2019   +BV on CV swab, 08/24/19 rx flagyl    Carpal tunnel syndrome of right wrist    Depression    Diabetes mellitus without complication (HCC)    Fibromyalgia    Wears glasses     Patient Active Problem List   Diagnosis Date Noted   Routine Papanicolaou smear 12/13/2021   Menorrhagia with regular cycle 12/13/2021   LLQ pain 12/13/2021   Status post tubal ligation 03/07/2020   Shoulder dystocia, delivered 03/06/2020   History of gestational hypertension 03/06/2020   Depression with anxiety 12/28/2019   Diabetes mellitus without complication (HCC)     Past Surgical History:  Procedure Laterality Date   RECONSTRUCTION SURGERY'S FOR LEFT CLUB FOOT  x8  total , from child to last one 2012   TRIGGER FINGER RELEASE Right 02/17/2015   Procedure: RIGHT CARPAL TUNNEL RELEASE;  Surgeon: Bradly Bienenstock, MD;  Location: [facility];  Service: Orthopedics;  Laterality: Right;   TUBAL LIGATION N/A 03/07/2020   Procedure: POST PARTUM TUBAL LIGATION;  Surgeon: Levie Heritage, DO;  Location: [facility];  Service: Gynecology;  Laterality: N/A;    OB History     Gravida  4   Para  3   Term  3   Preterm      AB  1   Living  3      SAB      IAB  1   Ectopic       Multiple  0   Live Births  3            Home Medications    Prior to Admission medications   Medication Sig Start Date End Date Taking? Authorizing Provider  albuterol (VENTOLIN HFA) 108 (90 Base) MCG/ACT inhaler Inhale 2 puffs into the lungs every 4 (four) hours as needed. 09/24/23  Yes Particia Nearing, PA-C  promethazine-dextromethorphan (PROMETHAZINE-DM) 6.25-15 MG/5ML syrup Take 5 mLs by mouth 4 (four) times daily as needed. 09/24/23  Yes Particia Nearing, PA-C  Semaglutide (OZEMPIC, 0.25 OR 0.5 MG/DOSE, [place]) Inject into the skin.   Yes [provider]  sertraline (ZOLOFT) 50 MG tablet Take 1 tablet (50 mg total) by mouth daily. 04/12/20  Yes Booker, Merlene Laughter, CNM  ACCU-CHEK GUIDE test strip USE 1 STRIP TO CHECK GLUCOSE 4 TIMES DAILY 01/05/20   [provider]  Blood Pressure Monitor MISC For regular home bp monitoring during pregnancy Patient not taking: Reported on 12/13/2021 09/09/19   Arabella Merles, CNM  ibuprofen (ADVIL) 600 MG tablet Take 1 tablet (600 mg total) by mouth every 6 (six) hours. Patient taking differently: Take 600 mg by mouth. 03/08/20   Calvert Cantor,  CNM  metFORMIN (GLUCOPHAGE) 500 MG tablet Take 1 tablet (500 mg total) by mouth daily with breakfast. 03/08/20   Calvert Cantor, CNM  metroNIDAZOLE (FLAGYL) 500 MG tablet Take 1 tablet (500 mg total) by mouth 2 (two) times daily. 04/02/22   Adline Potter, NP  Prenatal 27-1 MG TABS Take 1 tablet by mouth daily. Patient not taking: Reported on 12/13/2021 04/14/20   [provider]    Family History Family History  Problem Relation Age of Onset   Hypertension Maternal Grandmother    Hypertension Maternal Grandfather    Stroke Maternal Grandfather    Heart disease Maternal Grandfather    Cancer Maternal Aunt        maternal great aunt   Hypertension Mother    Diabetes Mother     Social History Social History   Tobacco Use   Smoking status: Never    Smokeless tobacco: Never  Vaping Use   Vaping status: Never Used  Substance Use Topics   Alcohol use: No   Drug use: No     Allergies   Patient has no known allergies.   Review of Systems Review of Systems Per HPI  Physical Exam Triage Vital Signs ED Triage Vitals  Encounter Vitals Group     BP 09/24/23 1040 135/79     Systolic BP Percentile --      Diastolic BP Percentile --      Pulse Rate 09/24/23 1040 (!) 108     Resp 09/24/23 1040 18     Temp 09/24/23 1040 99.4 F (37.4 C)     Temp Source 09/24/23 1040 Oral     SpO2 09/24/23 1040 95 %     Weight --      Height --      Head Circumference --      Peak Flow --      Pain Score 09/24/23 1038 8     Pain Loc --      Pain Education --      Exclude from Growth Chart --    No data found.  Updated Vital Signs BP 135/79 (BP Location: Right Arm)   Pulse (!) 108   Temp 99.4 F (37.4 C) (Oral)   Resp 18   LMP 09/10/2023 (Approximate)   SpO2 95%   Visual Acuity Right Eye Distance:   Left Eye Distance:   Bilateral Distance:    Right Eye Near:   Left Eye Near:    Bilateral Near:     Physical Exam Vitals and nursing note reviewed.  Constitutional:      Appearance: Normal appearance.  HENT:     Head: Atraumatic.     Right Ear: Tympanic membrane and external ear normal.     Left Ear: Tympanic membrane and external ear normal.     Nose: Rhinorrhea present.     Mouth/Throat:     Mouth: Mucous membranes are moist.     Pharynx: Posterior oropharyngeal erythema present.  Eyes:     Extraocular Movements: Extraocular movements intact.     Conjunctiva/sclera: Conjunctivae normal.  Cardiovascular:     Rate and Rhythm: Normal rate and regular rhythm.     Heart sounds: Normal heart sounds.  Pulmonary:     Effort: Pulmonary effort is normal.     Breath sounds: Wheezing present. No rales.  Musculoskeletal:        General: Normal range of motion.     Cervical back: Normal range of motion and neck supple.  Skin:     General: Skin is warm and dry.  Neurological:     Mental Status: She is alert and oriented to person, place, and time.  Psychiatric:        Mood and Affect: Mood normal.        Thought Content: Thought content normal.      UC Treatments / Results  Labs (all labs ordered are listed, but only abnormal results are displayed) Labs Reviewed  POC COVID19/FLU A&B COMBO    EKG   Radiology No results found.  Procedures Procedures (including critical care time)  Medications Ordered in UC Medications  dexamethasone (DECADRON) injection 10 mg (10 mg Intramuscular Given 09/24/23 1138)    Initial Impression / Assessment and Plan / UC Course  I have reviewed the triage vital signs and the nursing notes.  Pertinent labs & imaging results that were available during my care of the patient were reviewed by me and considered in my medical decision making (see chart for details).     Tachycardic in triage, otherwise vital signs reassuring.  Rapid flu and COVID-negative.  Suspect viral respiratory infection causing bronchitis.  Treat with IM Decadron, albuterol, Phenergan DM, supportive over-the-counter medications and home care.  Return for worsening symptoms.  Final Clinical Impressions(s) / UC Diagnoses   Final diagnoses:  Viral bronchitis  Wheezing  Tachycardia   Discharge Instructions   None    ED Prescriptions     Medication Sig Dispense Auth. Provider   albuterol (VENTOLIN HFA) 108 (90 Base) MCG/ACT inhaler Inhale 2 puffs into the lungs every 4 (four) hours as needed. 18 g Roosvelt Maser [place], [place]   promethazine-dextromethorphan (PROMETHAZINE-DM) 6.25-15 MG/5ML syrup Take 5 mLs by mouth 4 (four) times daily as needed. 100 mL Particia Nearing, [place]      PDMP not reviewed this encounter.   Particia Nearing, [place] 09/24/23 1150

## 2023-12-20 DIAGNOSIS — E1169 Type 2 diabetes mellitus with other specified complication: Secondary | ICD-10-CM | POA: Diagnosis not present

## 2023-12-20 DIAGNOSIS — R41840 Attention and concentration deficit: Secondary | ICD-10-CM | POA: Diagnosis not present

## 2023-12-20 DIAGNOSIS — F418 Other specified anxiety disorders: Secondary | ICD-10-CM | POA: Diagnosis not present

## 2023-12-20 DIAGNOSIS — Z0189 Encounter for other specified special examinations: Secondary | ICD-10-CM | POA: Diagnosis not present

## 2023-12-20 DIAGNOSIS — E7800 Pure hypercholesterolemia, unspecified: Secondary | ICD-10-CM | POA: Diagnosis not present

## 2024-01-21 DIAGNOSIS — M25561 Pain in right knee: Secondary | ICD-10-CM | POA: Diagnosis not present

## 2024-05-20 DIAGNOSIS — M25561 Pain in right knee: Secondary | ICD-10-CM | POA: Diagnosis not present

## 2024-06-16 ENCOUNTER — Telehealth: Payer: Self-pay

## 2024-06-16 DIAGNOSIS — Z5989 Other problems related to housing and economic circumstances: Secondary | ICD-10-CM

## 2024-07-16 ENCOUNTER — Telehealth: Payer: Self-pay

## 2024-07-16 NOTE — Progress Notes (Unsigned)
 Complex Care Management Note Care Guide Note  07/16/2024 Name: Kristy Fowler MRN: [id number] DOB: Dec 10, 1985   Complex Care Management Outreach Attempts: An unsuccessful telephone outreach was attempted today to offer the patient information about available complex care management services.  Follow Up Plan:  Additional outreach attempts will be made to offer the patient complex care management information and services.   Encounter Outcome:  No Answer  .Debbe Fuse [facility]  [facility], [facility] Guide  Direct Dial : [phone]  Fax [phone]

## 2024-07-22 NOTE — Progress Notes (Unsigned)
 Complex Care Management Note Care Guide Note  07/22/2024 Name: Kristy Fowler MRN: [id number] DOB: [birth year]   Complex Care Management Outreach Attempts: A second unsuccessful outreach was attempted today to offer the patient with information about available complex care management services.  Follow Up Plan:  Additional outreach attempts will be made to offer the patient complex care management information and services.   Encounter Outcome:  No Answer  Debbe Fuse [facility], [facility] Guide  Direct Dial : [phone]  Fax [phone]

## 2024-07-24 NOTE — Progress Notes (Signed)
 Complex Care Management Note Care Guide Note  07/24/2024 Name: Kristy Fowler MRN: [id number] DOB: 1985-08-14   Complex Care Management Outreach Attempts: A third unsuccessful outreach was attempted today to offer the patient with information about available complex care management services.  Follow Up Plan:  No further outreach attempts will be made at this time. We have been unable to contact the patient to offer or enroll patient in complex care management services.  Encounter Outcome:  No Answer  Debbe Fuse [facility] Guide  Direct Dial : [phone]  Fax [phone]
